# Patient Record
Sex: Female | Born: 1985 | Hispanic: Yes | Marital: Married | Smoking: Never smoker
Health system: Southern US, Community
[De-identification: ages and names within clinical notes are randomized; demographics above are authoritative.]

## PROBLEM LIST (undated history)

## (undated) DIAGNOSIS — Z789 Other specified health status: Secondary | ICD-10-CM

## (undated) HISTORY — PX: TONSILLECTOMY AND ADENOIDECTOMY: SUR1326

## (undated) HISTORY — PX: CLUB FOOT RELEASE: SHX1363

## (undated) HISTORY — PX: BARTHOLIN CYST MARSUPIALIZATION: SHX5383

---

## 2017-10-31 NOTE — L&D Delivery Note (Signed)
Pt completed the first stage rapidly after the IUPC was placed. She pushed for 25 min and was having worsening variable decels . The VE was placed at +2 station in the ROA position. She delivered one live viable female infant over an intact perineum with one push. Vaginal tear on right repaired with 3-0 chromic. Nuchal cord x 1. Placenta -S/I. EBL-400cc

## 2017-11-20 DIAGNOSIS — Z87768 Personal history of other specified (corrected) congenital malformations of integument, limbs and musculoskeletal system: Secondary | ICD-10-CM | POA: Insufficient documentation

## 2017-11-20 LAB — OB RESULTS CONSOLE GC/CHLAMYDIA
Chlamydia: NEGATIVE
GC PROBE AMP, GENITAL: NEGATIVE

## 2017-12-18 LAB — OB RESULTS CONSOLE ABO/RH: RH TYPE: POSITIVE

## 2017-12-18 LAB — OB RESULTS CONSOLE ANTIBODY SCREEN: ANTIBODY SCREEN: NEGATIVE

## 2017-12-18 LAB — OB RESULTS CONSOLE HIV ANTIBODY (ROUTINE TESTING): HIV: NONREACTIVE

## 2017-12-18 LAB — OB RESULTS CONSOLE RPR: RPR: NONREACTIVE

## 2017-12-18 LAB — OB RESULTS CONSOLE RUBELLA ANTIBODY, IGM: RUBELLA: IMMUNE

## 2017-12-18 LAB — OB RESULTS CONSOLE HEPATITIS B SURFACE ANTIGEN: HEP B S AG: NEGATIVE

## 2018-02-06 ENCOUNTER — Other Ambulatory Visit (HOSPITAL_COMMUNITY): Payer: Self-pay | Admitting: Obstetrics and Gynecology

## 2018-02-06 DIAGNOSIS — Z36 Encounter for antenatal screening for chromosomal anomalies: Secondary | ICD-10-CM

## 2018-02-07 ENCOUNTER — Encounter (HOSPITAL_COMMUNITY): Payer: Self-pay | Admitting: *Deleted

## 2018-02-08 ENCOUNTER — Encounter (HOSPITAL_COMMUNITY): Payer: Self-pay

## 2018-02-08 ENCOUNTER — Other Ambulatory Visit (HOSPITAL_COMMUNITY): Payer: Self-pay | Admitting: Obstetrics and Gynecology

## 2018-02-08 ENCOUNTER — Ambulatory Visit (HOSPITAL_COMMUNITY)
Admission: RE | Admit: 2018-02-08 | Discharge: 2018-02-08 | Disposition: A | Discharge status: Home / Self Care | Payer: 59 | Source: Ambulatory Visit | Attending: Obstetrics and Gynecology | Admitting: Obstetrics and Gynecology

## 2018-02-08 DIAGNOSIS — Z3689 Encounter for other specified antenatal screening: Secondary | ICD-10-CM

## 2018-02-08 DIAGNOSIS — Z3A20 20 weeks gestation of pregnancy: Secondary | ICD-10-CM

## 2018-02-08 DIAGNOSIS — Z8279 Family history of other congenital malformations, deformations and chromosomal abnormalities: Secondary | ICD-10-CM

## 2018-02-08 DIAGNOSIS — O43122 Velamentous insertion of umbilical cord, second trimester: Secondary | ICD-10-CM

## 2018-02-08 DIAGNOSIS — Z36 Encounter for antenatal screening for chromosomal anomalies: Secondary | ICD-10-CM

## 2018-02-08 HISTORY — DX: Other specified health status: Z78.9

## 2018-02-08 NOTE — Addendum Note (Signed)
Encounter addended by: Raoul PitchMurrow, Jalayna Josten Mae on: 02/08/2018 3:45 PM  Actions taken: Imaging Exam ended

## 2018-05-28 LAB — OB RESULTS CONSOLE GBS: STREP GROUP B AG: NEGATIVE

## 2018-07-01 ENCOUNTER — Inpatient Hospital Stay (HOSPITAL_COMMUNITY)
Admission: AD | Admit: 2018-07-01 | Discharge: 2018-07-04 | DRG: 807 | Disposition: A | Discharge status: Home / Self Care | Payer: 59 | Attending: Obstetrics and Gynecology | Admitting: Obstetrics and Gynecology

## 2018-07-01 ENCOUNTER — Encounter (HOSPITAL_COMMUNITY): Payer: Self-pay | Admitting: *Deleted

## 2018-07-01 DIAGNOSIS — Z3A41 41 weeks gestation of pregnancy: Secondary | ICD-10-CM

## 2018-07-01 DIAGNOSIS — O480 Post-term pregnancy: Principal | ICD-10-CM | POA: Diagnosis present

## 2018-07-01 DIAGNOSIS — Z3490 Encounter for supervision of normal pregnancy, unspecified, unspecified trimester: Secondary | ICD-10-CM

## 2018-07-01 LAB — CBC
HEMATOCRIT: 35.3 % — AB (ref 36.0–46.0)
HEMOGLOBIN: 12 g/dL (ref 12.0–15.0)
MCH: 30.9 pg (ref 26.0–34.0)
MCHC: 34 g/dL (ref 30.0–36.0)
MCV: 91 fL (ref 78.0–100.0)
PLATELETS: 269 10*3/uL (ref 150–400)
RBC: 3.88 MIL/uL (ref 3.87–5.11)
RDW: 13.6 % (ref 11.5–15.5)
WBC: 8.1 10*3/uL (ref 4.0–10.5)

## 2018-07-01 LAB — POCT FERN TEST: POCT Fern Test: POSITIVE

## 2018-07-01 MED ORDER — LACTATED RINGERS IV SOLN
INTRAVENOUS | Status: DC
  Administered 2018-07-01: 23:00:00 via INTRAVENOUS
  Administered 2018-07-02: 1000 mL via INTRAVENOUS
  Administered 2018-07-02 (×2): via INTRAVENOUS

## 2018-07-02 ENCOUNTER — Inpatient Hospital Stay (HOSPITAL_COMMUNITY): Payer: 59 | Admitting: Anesthesiology

## 2018-07-02 ENCOUNTER — Other Ambulatory Visit: Payer: Self-pay

## 2018-07-02 LAB — TYPE AND SCREEN
ABO/RH(D): A POS
Antibody Screen: NEGATIVE

## 2018-07-02 LAB — ABO/RH: ABO/RH(D): A POS

## 2018-07-02 LAB — RPR: RPR Ser Ql: NONREACTIVE

## 2018-07-02 MED ORDER — TERBUTALINE SULFATE 1 MG/ML IJ SOLN
0.2500 mg | Freq: Once | INTRAMUSCULAR | Status: DC | PRN
  Filled 2018-07-02: qty 1

## 2018-07-02 MED ORDER — OXYTOCIN 40 UNITS IN LACTATED RINGERS INFUSION - SIMPLE MED: 2.5000 [IU]/h | INTRAVENOUS | Status: DC

## 2018-07-02 MED ORDER — EPHEDRINE 5 MG/ML INJ
10.0000 mg | INTRAVENOUS | Status: DC | PRN
  Filled 2018-07-02: qty 2
  Filled 2018-07-02: qty 4

## 2018-07-02 MED ORDER — FENTANYL CITRATE (PF) 100 MCG/2ML IJ SOLN
100.0000 ug | INTRAMUSCULAR | Status: DC | PRN
  Administered 2018-07-02: 100 ug via INTRAVENOUS

## 2018-07-02 MED ORDER — OXYTOCIN 40 UNITS IN LACTATED RINGERS INFUSION - SIMPLE MED
INTRAVENOUS | Status: AC
  Filled 2018-07-02: qty 1000

## 2018-07-02 MED ORDER — OXYCODONE-ACETAMINOPHEN 5-325 MG PO TABS: 2.0000 | ORAL_TABLET | ORAL | Status: DC | PRN

## 2018-07-02 MED ORDER — OXYTOCIN 40 UNITS IN LACTATED RINGERS INFUSION - SIMPLE MED
1.0000 m[IU]/min | INTRAVENOUS | Status: DC
  Administered 2018-07-02: 2 m[IU]/min via INTRAVENOUS

## 2018-07-02 MED ORDER — SODIUM BICARBONATE 8.4 % IV SOLN
INTRAVENOUS | Status: DC | PRN
  Administered 2018-07-02 (×2): 4 mL via EPIDURAL

## 2018-07-02 MED ORDER — FLEET ENEMA 7-19 GM/118ML RE ENEM: 1.0000 | ENEMA | RECTAL | Status: DC | PRN

## 2018-07-02 MED ORDER — ONDANSETRON HCL 4 MG/2ML IJ SOLN: 4.0000 mg | Freq: Four times a day (QID) | INTRAMUSCULAR | Status: DC | PRN

## 2018-07-02 MED ORDER — LACTATED RINGERS IV SOLN: 500.0000 mL | Freq: Once | INTRAVENOUS | Status: DC

## 2018-07-02 MED ORDER — OXYCODONE-ACETAMINOPHEN 5-325 MG PO TABS: 1.0000 | ORAL_TABLET | ORAL | Status: DC | PRN

## 2018-07-02 MED ORDER — FENTANYL 2.5 MCG/ML BUPIVACAINE 1/10 % EPIDURAL INFUSION (WH - ANES)
14.0000 mL/h | INTRAMUSCULAR | Status: DC | PRN
  Administered 2018-07-02 (×3): 14 mL/h via EPIDURAL
  Filled 2018-07-02 (×2): qty 100

## 2018-07-02 MED ORDER — ACETAMINOPHEN 325 MG PO TABS: 650.0000 mg | ORAL_TABLET | ORAL | Status: DC | PRN

## 2018-07-02 MED ORDER — DIPHENHYDRAMINE HCL 50 MG/ML IJ SOLN: 12.5000 mg | INTRAMUSCULAR | Status: DC | PRN

## 2018-07-02 MED ORDER — PHENYLEPHRINE 40 MCG/ML (10ML) SYRINGE FOR IV PUSH (FOR BLOOD PRESSURE SUPPORT)
80.0000 ug | PREFILLED_SYRINGE | INTRAVENOUS | Status: DC | PRN
  Administered 2018-07-02: 40 ug via INTRAVENOUS
  Filled 2018-07-02: qty 10
  Filled 2018-07-02: qty 5
  Filled 2018-07-02: qty 10

## 2018-07-02 MED ORDER — FENTANYL CITRATE (PF) 100 MCG/2ML IJ SOLN
INTRAMUSCULAR | Status: AC
  Filled 2018-07-02: qty 2

## 2018-07-02 MED ORDER — BUPIVACAINE HCL (PF) 0.25 % IJ SOLN
INTRAMUSCULAR | Status: DC | PRN
  Administered 2018-07-02: 8 mL via EPIDURAL

## 2018-07-02 MED ORDER — LIDOCAINE HCL (PF) 1 % IJ SOLN
30.0000 mL | INTRAMUSCULAR | Status: DC | PRN
  Administered 2018-07-02: 30 mL via SUBCUTANEOUS
  Filled 2018-07-02: qty 30

## 2018-07-02 MED ORDER — IBUPROFEN 600 MG PO TABS
600.0000 mg | ORAL_TABLET | Freq: Four times a day (QID) | ORAL | Status: DC
  Administered 2018-07-02 – 2018-07-04 (×7): 600 mg via ORAL
  Filled 2018-07-02 (×6): qty 1

## 2018-07-02 MED ORDER — OXYTOCIN BOLUS FROM INFUSION
500.0000 mL | Freq: Once | INTRAVENOUS | Status: AC
  Administered 2018-07-02: 500 mL via INTRAVENOUS

## 2018-07-02 MED ORDER — EPHEDRINE 5 MG/ML INJ
10.0000 mg | INTRAVENOUS | Status: DC | PRN
  Administered 2018-07-02: 10 mg via INTRAVENOUS
  Filled 2018-07-02: qty 2

## 2018-07-02 MED ORDER — LACTATED RINGERS IV SOLN: 500.0000 mL | INTRAVENOUS | Status: DC | PRN

## 2018-07-02 MED ORDER — PHENYLEPHRINE 40 MCG/ML (10ML) SYRINGE FOR IV PUSH (FOR BLOOD PRESSURE SUPPORT)
80.0000 ug | PREFILLED_SYRINGE | INTRAVENOUS | Status: AC | PRN
  Administered 2018-07-02 (×3): 40 ug via INTRAVENOUS

## 2018-07-02 MED ORDER — SOD CITRATE-CITRIC ACID 500-334 MG/5ML PO SOLN: 30.0000 mL | ORAL | Status: DC | PRN

## 2018-07-02 NOTE — Anesthesia Preprocedure Evaluation (Signed)
Anesthesia Evaluation  Patient identified by MRN, date of birth, ID band Patient awake    Reviewed: Allergy & Precautions, NPO status , Patient's Chart, lab work & pertinent test results  Airway Mallampati: I  TM Distance: >3 FB Neck ROM: Full    Dental no notable dental hx.    Pulmonary neg pulmonary ROS,    Pulmonary exam normal breath sounds clear to auscultation       Cardiovascular negative cardio ROS Normal cardiovascular exam Rhythm:Regular Rate:Normal     Neuro/Psych negative neurological ROS  negative psych ROS   GI/Hepatic negative GI ROS, Neg liver ROS,   Endo/Other  negative endocrine ROS  Renal/GU negative Renal ROS  negative genitourinary   Musculoskeletal negative musculoskeletal ROS (+)   Abdominal   Peds  Hematology negative hematology ROS (+)   Anesthesia Other Findings   Reproductive/Obstetrics (+) Pregnancy                             Anesthesia Physical Anesthesia Plan  ASA: II  Anesthesia Plan: Epidural   Post-op Pain Management:    Induction:   PONV Risk Score and Plan: Treatment may vary due to age or medical condition  Airway Management Planned: Natural Airway  Additional Equipment:   Intra-op Plan:   Post-operative Plan:   Informed Consent: I have reviewed the patients History and Physical, chart, labs and discussed the procedure including the risks, benefits and alternatives for the proposed anesthesia with the patient or authorized representative who has indicated his/her understanding and acceptance.       Plan Discussed with: Anesthesiologist  Anesthesia Plan Comments: (Patient identified. Risks, benefits, options discussed with patient including but not limited to bleeding, infection, nerve damage, paralysis, failed block, incomplete pain control, headache, blood pressure changes, nausea, vomiting, reactions to medication, itching, and  post partum back pain. Confirmed with bedside nurse the patient's most recent platelet count. Confirmed with the patient that they are not taking any anticoagulation, have any bleeding history or any family history of bleeding disorders. Patient expressed understanding and wishes to proceed. All questions were answered. )        Anesthesia Quick Evaluation  

## 2018-07-02 NOTE — Anesthesia Pain Management Evaluation Note (Signed)
  CRNA Pain Management Visit Note  Patient: Jordan Mendez, 32 y.o., female  "Hello I am a member of the anesthesia team at [facility]. We have an anesthesia team available at all times to provide care throughout the hospital, including epidural management and anesthesia for C-section. I don't know your plan for the delivery whether it a natural birth, water birth, IV sedation, nitrous supplementation, doula or epidural, but we want to meet your pain goals."   1.Was your pain managed to your expectations on prior hospitalizations?   No prior hospitalizations  2.What is your expectation for pain management during this hospitalization?     Epidural  3.How can we help you reach that goal? Epidural requested.  Record the patient's initial score and the patient's pain goal.   Pain: 6  Pain Goal: 6 The [facility] wants you to be able to say your pain was always managed very well.  Cordai Rodrigue 07/02/2018

## 2018-07-02 NOTE — H&P (Signed)
Jordan Mendez is an 32 y.o. G1P0 [redacted]w[redacted]d hispanic female who presented to the ER with possible ROM. She was scheduled for an induction last night for post term preg. Her PNC was uncomplicated. She did not have genetic testing. She has a nl Ogtt/ Neg GBS. Chief Complaint: HPI:  Past Medical History:  Diagnosis Date  . Medical history non-contributory     Past Surgical History:  Procedure Laterality Date  . BARTHOLIN CYST MARSUPIALIZATION    . CLUB FOOT RELEASE    . TONSILLECTOMY AND ADENOIDECTOMY      Family History  Problem Relation Age of Onset  . Diabetes Maternal Grandmother    Social History:  reports that she has never smoked. She has never used smokeless tobacco. She reports that she drank alcohol. She reports that she does not use drugs.  Allergies: No Known Allergies  Medications Prior to Admission  Medication Sig Dispense Refill  . FOLIC ACID PO Take 1 tablet by mouth daily.     . Prenatal Multivit-Min-Fe-FA (PRENATAL VITAMINS PO) Take 1 tablet by mouth daily.          Blood pressure 134/78, pulse 71, temperature 98 F (36.7 C), temperature source Axillary, resp. rate 16, height 5\' 1"  (1.549 m), weight 67.1 kg, last menstrual period 09/24/2017, SpO2 95 %. General appearance: alert and cooperative Abdomen: gravid, non tender   Lab Results  Component Value Date   WBC 8.1 07/01/2018   HGB 12.0 07/01/2018   HCT 35.3 (L) 07/01/2018   MCV 91.0 07/01/2018   PLT 269 07/01/2018   No results found for: PREGTESTUR, PREGSERUM, HCG, HCGQUANT     Patient Active Problem List   Diagnosis Date Noted  . Normal labor 07/02/2018   IMP/ IUP at 41 weeks Plan/ Will start induction for post term preg  Jordan Mendez E 07/02/2018, 7:12 PM

## 2018-07-02 NOTE — Progress Notes (Signed)
This note also relates to the following rows which could not be included: BP - Cannot attach notes to unvalidated device data Pulse Rate - Cannot attach notes to unvalidated device data  Dr Mackey Birchwood decreased epidural rate to 10, and is pushing ephedrine

## 2018-07-02 NOTE — Progress Notes (Signed)
Pt had AROM this am when she was 4cm. She was also started on pit aug. She has had a protracted labor curve. Her pelvis appears to be adequate . EFW- 7-8 PLAN/ Will place an IUPC to assess the strength of the contractions.

## 2018-07-02 NOTE — Progress Notes (Signed)
This note also relates to the following rows which could not be included: BP - Cannot attach notes to unvalidated device data Pulse Rate - Cannot attach notes to unvalidated device data  DR Armond Hang in room

## 2018-07-02 NOTE — Anesthesia Procedure Notes (Signed)
Epidural Patient location during procedure: OB Start time: 07/02/2018 8:36 AM End time: 07/02/2018 8:50 AM  Staffing Anesthesiologist: Elmer Picker, MD Performed: anesthesiologist   Preanesthetic Checklist Completed: patient identified, pre-op evaluation, timeout performed, IV checked, risks and benefits discussed and monitors and equipment checked  Epidural Patient position: sitting Prep: site prepped and draped and DuraPrep Patient monitoring: continuous pulse ox, blood pressure, heart rate and cardiac monitor Approach: midline Location: L3-L4 Injection technique: LOR air  Needle:  Needle type: Tuohy  Needle gauge: 17 G Needle length: 9 cm Needle insertion depth: 5.5 cm Catheter type: closed end flexible Catheter size: 19 Gauge Catheter at skin depth: 11 cm Test dose: negative  Assessment Sensory level: T8 Events: blood not aspirated, injection not painful, no injection resistance, negative IV test and no paresthesia  Additional Notes Patient identified. Risks/Benefits/Options discussed with patient including but not limited to bleeding, infection, nerve damage, paralysis, failed block, incomplete pain control, headache, blood pressure changes, nausea, vomiting, reactions to medication both or allergic, itching and postpartum back pain. Confirmed with bedside nurse the patient's most recent platelet count. Confirmed with patient that they are not currently taking any anticoagulation, have any bleeding history or any family history of bleeding disorders. Patient expressed understanding and wished to proceed. All questions were answered. Sterile technique was used throughout the entire procedure. Please see nursing notes for vital signs. Test dose was given through epidural catheter and negative prior to continuing to dose epidural or start infusion. Warning signs of high block given to the patient including shortness of breath, tingling/numbness in hands, complete motor block,  or any concerning symptoms with instructions to call for help. Patient was given instructions on fall risk and not to get out of bed. All questions and concerns addressed with instructions to call with any issues or inadequate analgesia.  Reason for block:procedure for pain

## 2018-07-02 NOTE — Progress Notes (Signed)
This note also relates to the following rows which could not be included: BP - Cannot attach notes to unvalidated device data Pulse Rate - Cannot attach notes to unvalidated device data  40 mcg Phenyephrine per Dr Dareen Piano request

## 2018-07-02 NOTE — Progress Notes (Signed)
This note also relates to the following rows which could not be included: BP - Cannot attach notes to unvalidated device data Pulse Rate - Cannot attach notes to unvalidated device data SpO2 - Cannot attach notes to unvalidated device data  Pt feels hot and see spots , resolved inside of 1 min

## 2018-07-03 ENCOUNTER — Encounter (HOSPITAL_COMMUNITY): Payer: Self-pay

## 2018-07-03 DIAGNOSIS — Z3490 Encounter for supervision of normal pregnancy, unspecified, unspecified trimester: Secondary | ICD-10-CM

## 2018-07-03 LAB — CBC
HEMATOCRIT: 26.6 % — AB (ref 36.0–46.0)
HEMOGLOBIN: 9.3 g/dL — AB (ref 12.0–15.0)
MCH: 31.7 pg (ref 26.0–34.0)
MCHC: 35 g/dL (ref 30.0–36.0)
MCV: 90.8 fL (ref 78.0–100.0)
Platelets: 248 10*3/uL (ref 150–400)
RBC: 2.93 MIL/uL — ABNORMAL LOW (ref 3.87–5.11)
RDW: 13.8 % (ref 11.5–15.5)
WBC: 15 10*3/uL — ABNORMAL HIGH (ref 4.0–10.5)

## 2018-07-03 MED ORDER — MEASLES, MUMPS & RUBELLA VAC ~~LOC~~ INJ
0.5000 mL | INJECTION | Freq: Once | SUBCUTANEOUS | Status: DC
  Filled 2018-07-03: qty 0.5

## 2018-07-03 MED ORDER — TETANUS-DIPHTH-ACELL PERTUSSIS 5-2.5-18.5 LF-MCG/0.5 IM SUSP: 0.5000 mL | Freq: Once | INTRAMUSCULAR | Status: DC

## 2018-07-03 MED ORDER — FERROUS SULFATE 325 (65 FE) MG PO TABS
325.0000 mg | ORAL_TABLET | Freq: Every day | ORAL | Status: DC
  Administered 2018-07-03 – 2018-07-04 (×2): 325 mg via ORAL
  Filled 2018-07-03 (×2): qty 1

## 2018-07-03 MED ORDER — BENZOCAINE-MENTHOL 20-0.5 % EX AERO
1.0000 "application " | INHALATION_SPRAY | CUTANEOUS | Status: DC | PRN
  Administered 2018-07-03: 1 via TOPICAL
  Filled 2018-07-03: qty 56

## 2018-07-03 MED ORDER — ONDANSETRON HCL 4 MG PO TABS: 4.0000 mg | ORAL_TABLET | ORAL | Status: DC | PRN

## 2018-07-03 MED ORDER — DIBUCAINE 1 % RE OINT: 1.0000 "application " | TOPICAL_OINTMENT | RECTAL | Status: DC | PRN

## 2018-07-03 MED ORDER — ACETAMINOPHEN 325 MG PO TABS
650.0000 mg | ORAL_TABLET | ORAL | Status: DC | PRN
  Administered 2018-07-03 (×2): 650 mg via ORAL
  Filled 2018-07-03 (×2): qty 2

## 2018-07-03 MED ORDER — COCONUT OIL OIL
1.0000 "application " | TOPICAL_OIL | Status: DC | PRN
  Administered 2018-07-04: 1 via TOPICAL
  Filled 2018-07-03: qty 120

## 2018-07-03 MED ORDER — ONDANSETRON HCL 4 MG/2ML IJ SOLN: 4.0000 mg | INTRAMUSCULAR | Status: DC | PRN

## 2018-07-03 MED ORDER — WITCH HAZEL-GLYCERIN EX PADS: 1.0000 "application " | MEDICATED_PAD | CUTANEOUS | Status: DC | PRN

## 2018-07-03 MED ORDER — OXYCODONE-ACETAMINOPHEN 5-325 MG PO TABS: 2.0000 | ORAL_TABLET | ORAL | Status: DC | PRN

## 2018-07-03 MED ORDER — SENNOSIDES-DOCUSATE SODIUM 8.6-50 MG PO TABS
2.0000 | ORAL_TABLET | ORAL | Status: DC
  Administered 2018-07-03: 2 via ORAL

## 2018-07-03 MED ORDER — OXYCODONE-ACETAMINOPHEN 5-325 MG PO TABS
1.0000 | ORAL_TABLET | ORAL | Status: DC | PRN
  Filled 2018-07-03: qty 1

## 2018-07-03 MED ORDER — ZOLPIDEM TARTRATE 5 MG PO TABS: 5.0000 mg | ORAL_TABLET | Freq: Every evening | ORAL | Status: DC | PRN

## 2018-07-03 MED ORDER — SIMETHICONE 80 MG PO CHEW: 80.0000 mg | CHEWABLE_TABLET | ORAL | Status: DC | PRN

## 2018-07-03 NOTE — Progress Notes (Signed)
Patient is eating, ambulating, voiding.  Pain control is good.  Vitals:   07/03/18 0025 07/03/18 0057 07/03/18 0149 07/03/18 0552  BP:  126/80 122/79 110/77  Pulse:  98 (!) 109 94  Resp: 16 18 18 18   Temp:  98.1 F (36.7 C) 97.9 F (36.6 C) 98.3 F (36.8 C)  TempSrc:  Oral Oral Oral  SpO2:  97%    Weight:      Height:        Fundus firm Perineum without swelling.  Lab Results  Component Value Date   WBC 15.0 (H) 07/03/2018   HGB 9.3 (L) 07/03/2018   HCT 26.6 (L) 07/03/2018   MCV 90.8 07/03/2018   PLT 248 07/03/2018    --/--/A POS, A POS Performed at [facility], [address] [place], [place] [address]  (09/01 2309)/[place]  A/P Post partum day 1.  Routine care.  Expect d/c tomorrow.  Iron.  Jennifer Payes A

## 2018-07-03 NOTE — Anesthesia Postprocedure Evaluation (Signed)
Anesthesia Post Note  Patient: Jordan Mendez  Procedure(s) Performed: AN AD HOC LABOR EPIDURAL     Patient location during evaluation: Mother Baby Anesthesia Type: Epidural Level of consciousness: awake and alert and oriented Pain management: satisfactory to patient Vital Signs Assessment: post-procedure vital signs reviewed and stable Respiratory status: respiratory function stable Cardiovascular status: stable Postop Assessment: no headache, no backache, epidural receding, patient able to bend at knees, no signs of nausea or vomiting and adequate PO intake Anesthetic complications: no    Last Vitals:  Vitals:   07/03/18 0149 07/03/18 0552  BP: 122/79 110/77  Pulse: (!) 109 94  Resp: 18 18  Temp: 36.6 C 36.8 C  SpO2:      Last Pain:  Vitals:   07/03/18 0552  TempSrc: Oral  PainSc: 0-No pain   Pain Goal: Patients Stated Pain Goal: 2 (07/02/18 2349)               Karleen Dolphin

## 2018-07-04 MED ORDER — DOCUSATE SODIUM 100 MG PO CAPS: 100.0000 mg | ORAL_CAPSULE | Freq: Two times a day (BID) | ORAL | 0 refills | Status: DC

## 2018-07-04 MED ORDER — OXYCODONE-ACETAMINOPHEN 5-325 MG PO TABS: 1.0000 | ORAL_TABLET | Freq: Four times a day (QID) | ORAL | 0 refills | Status: DC | PRN

## 2018-07-04 MED ORDER — IBUPROFEN 600 MG PO TABS: 600.0000 mg | ORAL_TABLET | Freq: Four times a day (QID) | ORAL | 0 refills | Status: DC | PRN

## 2018-07-04 NOTE — Lactation Note (Signed)
This note was copied from a baby's chart. Lactation Consultation Note  Patient Name: Jordan Mendez [id number] Date: 07/04/2018 Reason for consult: Follow-up assessment;Nipple pain/trauma  Visited with P1 Mom of term baby at 18 hrs old.  Mom states her nipples are sore, no observable nipple trauma noted. Breasts small, firm, small areola, and long erect nipples.   Offered to assist with positioning and latching baby deeper onto breast.  Positioned baby in cross cradle hold with added pillow support.  Mom latched baby before she opened widely.  Took baby off, nipple elongated and slightly pinched.  Burped baby, and repositioned with instructions to wait for baby to open wider.  Colostrum easily expressed. Baby started to show signs of struggling to get her breath.  Took baby away from breast.  Baby became dusky circumorally.  Bulb syringe used.  Baby struggled for about a minute with on and off crying.  Baby spit up mucousy yellow emesis, small amount. Baby's color quickly recovered, with normal breathing noted. After baby recovered, about 3-5 mins. Positioned baby in cross cradle hold, when she started acting like she was choking again.   Sat baby up and patted her back.  She spit a medium amount of frothy emesis, bulb syringe used.  Baby's color remained good throughout. Baby placed STS on Mom's chest.  Baby resting.   Mom to call for latch assist when baby starts cueing.  Consult Status Consult Status: Follow-up Date: 07/04/18 Follow-up type: In-patient    [name] 07/04/2018, 10:23 AM

## 2018-07-04 NOTE — Discharge Instructions (Signed)
 Lactancia materna Breastfeeding Decidir amamantar es una de las mejores elecciones que puede hacer por usted y su beb. Un cambio en las hormonas durante el embarazo hace que las mamas produzcan leche materna en las glndulas productoras de leche. Las hormonas impiden que la leche materna sea liberada antes del nacimiento del beb. Adems, impulsan el flujo de leche luego del nacimiento. Una vez que ha comenzado a amamantar, pensar en el beb, as como la succin o el llanto, pueden estimular la liberacin de leche de las glndulas productoras de leche. Los beneficios de amamantar Las investigaciones demuestran que la lactancia materna ofrece muchos beneficios de salud para bebs y madres. Adems, ofrece una forma gratuita y conveniente de alimentar al beb. Para el beb  La primera leche (calostro) ayuda a mejorar el funcionamiento del aparato digestivo del beb.  Las clulas especiales de la leche (anticuerpos) ayudan a combatir las infecciones en el beb.  Los bebs que se alimentan con leche materna tambin tienen menos probabilidades de tener asma, alergias, obesidad o diabetes de tipo 2. Adems, tienen menor riesgo de sufrir el sndrome de muerte sbita del lactante (SMSL).  Los nutrientes de la leche materna son mejores para satisfacer las necesidades del beb en comparacin con la leche maternizada.  La leche materna mejora el desarrollo cerebral del beb. Para usted  La lactancia materna favorece el desarrollo de un vnculo muy especial entre la madre y el beb.  Es conveniente. La leche materna es econmica y siempre est disponible a la temperatura correcta.  La lactancia materna ayuda a quemar caloras. Le ayuda a perder el peso ganado durante el embarazo.  Hace que el tero vuelva al tamao que tena antes del embarazo ms rpido. Adems, disminuye el sangrado (loquios) despus del parto.  La lactancia materna contribuye a reducir el riesgo de tener diabetes de tipo 2,  osteoporosis, artritis reumatoide, enfermedades cardiovasculares y cncer de mama, ovario, tero y endometrio en el futuro. Informacin bsica sobre la lactancia Comienzo de la lactancia  Encuentre un lugar cmodo para sentarse o acostarse, con un buen respaldo para el cuello y la espalda.  Coloque una almohada o una manta enrollada debajo del beb para acomodarlo a la altura de la mama (si est sentada). Las almohadas para amamantar se han diseado especialmente a fin de servir de apoyo para los brazos y el beb mientras amamanta.  Asegrese de que la barriga del beb (abdomen) est frente a la suya.  Masajee suavemente la mama. Con las yemas de los dedos, masajee los bordes exteriores de la mama hacia adentro, en direccin al pezn. Esto estimula el flujo de leche. Si la leche fluye lentamente, es posible que deba continuar con este movimiento durante la lactancia.  Sostenga la mama con 4 dedos por debajo y el pulgar por arriba del pezn (forme la letra "C" con la mano). Asegrese de que los dedos se encuentren lejos del pezn y de la boca del beb.  Empuje suavemente los labios del beb con el pezn o con el dedo.  Cuando la boca del beb se abra lo suficiente, acrquelo rpidamente a la mama e introduzca todo el pezn y la arola, tanto como sea posible, dentro de la boca del beb. La arola es la zona de color que rodea al pezn. ? Debe haber ms arola visible por arriba del labio superior del beb que por debajo del labio inferior. ? Los labios del beb deben estar abiertos y extendidos hacia afuera (evertidos) para asegurar que   el beb se prenda de forma adecuada y cmoda. ? La lengua del beb debe estar entre la enca inferior y la mama.  Asegrese de que la boca del beb est en la posicin correcta alrededor del pezn (prendido). Los labios del beb deben crear un sello sobre la mama y estar doblados hacia afuera (invertidos).  Es comn que el beb succione durante 2 a 3 minutos  para que comience el flujo de leche materna. Cmo debe prenderse Es muy importante que le ensee al beb cmo prenderse adecuadamente a la mama. Si el beb no se prende adecuadamente, puede causar dolor en los pezones, reducir la produccin de leche materna y hacer que el beb tenga un escaso aumento de peso. Adems, si el beb no se prende adecuadamente al pezn, puede tragar aire durante la alimentacin. Esto puede causarle molestias al beb. Hacer eructar al beb al cambiar de mama puede ayudarlo a liberar el aire. Sin embargo, ensearle al beb cmo prenderse a la mama adecuadamente es la mejor manera de evitar que se sienta molesto por tragar aire mientras se alimenta. Signos de que el beb se ha prendido adecuadamente al pezn  Tironea o succiona de modo silencioso, sin causarle dolor. Los labios del beb deben estar extendidos hacia afuera (evertidos).  Se escucha que traga cada 3 o 4 succiones una vez que la leche ha comenzado a fluir (despus de que se produzca el reflejo de eyeccin de la leche).  Hay movimientos musculares por arriba y por delante de sus odos al succionar.  Signos de que el beb no se ha prendido adecuadamente al pezn  Hace ruidos de succin o de chasquido mientras se alimenta.  Siente dolor en los pezones.  Si cree que el beb no se prendi correctamente, deslice el dedo en la comisura de la boca y colquelo entre las encas del beb para interrumpir la succin. Intente volver a comenzar a amamantar. Signos de lactancia materna exitosa Signos del beb  El beb disminuir gradualmente el nmero de succiones o dejar de succionar por completo.  El beb se quedar dormido.  El cuerpo del beb se relajar.  El beb retendr una pequea cantidad de leche en la boca.  El beb se desprender solo del pecho.  Signos que presenta usted  Las mamas han aumentado la firmeza, el peso y el tamao 1 a 3 horas despus de amamantar.  Estn ms blandas inmediatamente  despus de amamantar.  Se producen un aumento del volumen de leche y un cambio en su consistencia y color hacia el quinto da de lactancia.  Los pezones no duelen, no estn agrietados ni sangran.  Signos de que su beb recibe la cantidad de leche suficiente  Mojar por lo menos 1 o 2paales durante las primeras 24horas despus del nacimiento.  Mojar por lo menos 5 o 6paales cada 24horas durante la primera semana despus del nacimiento. La orina debe ser clara o de color amarillo plido a los 5das de vida.  Mojar entre 6 y 8paales cada 24horas a medida que el beb sigue creciendo y desarrollndose.  Defeca por lo menos 3 veces en 24 horas a los 5 das de vida. Las heces deben ser blandas y amarillentas.  Defeca por lo menos 3 veces en 24 horas a los 7 das de vida. Las heces deben ser grumosas y amarillentas.  No registra una prdida de peso mayor al 10% del peso al nacer durante los primeros 3 das de vida.  Aumenta de peso un   promedio de 4 a 7onzas (113 a 198g) por semana despus de los 4 das de vida.  Aumenta de peso, diariamente, de manera uniforme a partir de los 5 das de vida, sin registrar prdida de peso despus de las 2semanas de vida. Despus de alimentarse, es posible que el beb regurgite una pequea cantidad de leche. Esto es normal. Frecuencia y duracin de la lactancia El amamantamiento frecuente la ayudar a producir ms leche y puede prevenir dolores en los pezones y las mamas extremadamente llenas (congestin mamaria). Alimente al beb cuando muestre signos de hambre o si siente la necesidad de reducir la congestin de las mamas. Esto se denomina "lactancia a demanda". Las seales de que el beb tiene hambre incluyen las siguientes:  Aumento del estado de alerta, actividad o inquietud.  Mueve la cabeza de un lado a otro.  Abre la boca cuando se le toca la mejilla o la comisura de la boca (reflejo de bsqueda).  Aumenta las vocalizaciones, tales como  sonidos de succin, se relame los labios, emite arrullos, suspiros o chirridos.  Mueve la mano hacia la boca y se chupa los dedos o las manos.  Est molesto o llora.  Evite el uso del chupete en las primeras 4 a 6 semanas despus del nacimiento del beb. Despus de este perodo, podr usar un chupete. Las investigaciones demostraron que el uso del chupete durante el primer ao de vida del beb disminuye el riesgo de tener el sndrome de muerte sbita del lactante (SMSL). Permita que el nio se alimente en cada mama todo lo que desee. Cuando el beb se desprende o se queda dormido mientras se est alimentando de la primera mama, ofrzcale la segunda. Debido a que, con frecuencia, los recin nacidos estn somnolientos las primeras semanas de vida, es posible que deba despertar al beb para alimentarlo. Los horarios de lactancia varan de un beb a otro. Sin embargo, las siguientes reglas pueden servir como gua para ayudarla a garantizar que el beb se alimenta adecuadamente:  Se puede amamantar a los recin nacidos (bebs de 4 semanas o menos de vida) cada 1 a 3 horas.  No deben transcurrir ms de 3 horas durante el da o 5 horas durante la noche sin que se amamante a los recin nacidos.  Debe amamantar al beb un mnimo de 8 veces en un perodo de 24 horas.  Extraccin de leche materna La extraccin y el almacenamiento de la leche materna le permiten asegurarse de que el beb se alimente exclusivamente de su leche materna, aun en momentos en los que no puede amamantar. Esto tiene especial importancia si debe regresar al trabajo en el perodo en que an est amamantando o si no puede estar presente en los momentos en que el beb debe alimentarse. Su asesor en lactancia puede ayudarla a encontrar un mtodo de extraccin que funcione mejor para usted y orientarla sobre cunto tiempo es seguro almacenar leche materna. Cmo cuidar las mamas durante la lactancia Los pezones pueden secarse, agrietarse y  doler durante la lactancia. Las siguientes recomendaciones pueden ayudarla a mantener las mamas humectadas y sanas:  Evite usar jabn en los pezones.  Use un sostn de soporte diseado especialmente para la lactancia materna. Evite usar sostenes con aro o sostenes muy ajustados (sostenes deportivos).  Seque al aire sus pezones durante 3 a 4minutos despus de amamantar al beb.  Utilice solo apsitos de algodn en el sostn para absorber las prdidas de leche. La prdida de un poco de leche materna   entre las tomas es normal.  Utilice lanolina sobre los pezones luego de amamantar. La lanolina ayuda a mantener la humedad normal de la piel. La lanolina pura no es perjudicial (no es txica) para el beb. Adems, puede extraer manualmente algunas gotas de leche materna y masajear suavemente esa leche sobre los pezones para que la leche se seque al aire.  Durante las primeras semanas despus del nacimiento, algunas mujeres experimentan congestin mamaria. La congestin mamaria puede hacer que sienta las mamas pesadas, calientes y sensibles al tacto. El pico de la congestin mamaria ocurre en el plazo de los 3 a 5 das despus del parto. Las siguientes recomendaciones pueden ayudarla a aliviar la congestin mamaria:  Vace por completo las mamas al amamantar o extraer leche. Puede aplicar calor hmedo en las mamas (en la ducha o con toallas hmedas para manos) antes de amamantar o extraer leche. Esto aumenta la circulacin y ayuda a que la leche fluya. Si el beb no vaca por completo las mamas cuando lo amamanta, extraiga la leche restante despus de que haya finalizado.  Aplique compresas de hielo sobre las mamas inmediatamente despus de amamantar o extraer leche, a menos que le resulte demasiado incmodo. Haga lo siguiente: ? Ponga el hielo en una bolsa plstica. ? Coloque una toalla entre la piel y la bolsa de hielo. ? Coloque el hielo durante 20minutos, 2 o 3veces por da.  Asegrese de que el  beb est prendido y se encuentre en la posicin correcta mientras lo alimenta.  Si la congestin mamaria persiste luego de 48 horas o despus de seguir estas recomendaciones, comunquese con su mdico o un asesor en lactancia. Recomendaciones de salud general durante la lactancia  Consuma 3 comidas y 3 colaciones saludables todos los das. Las madres bien alimentadas que amamantan necesitan entre 450 y 500 caloras adicionales por da. Puede cumplir con este requisito al aumentar la cantidad de una dieta equilibrada que realice.  Beba suficiente agua para mantener la orina clara o de color amarillo plido.  Descanse con frecuencia, reljese y siga tomando sus vitaminas prenatales para prevenir la fatiga, el estrs y los niveles bajos de vitaminas y minerales en el cuerpo (deficiencias de nutrientes).  No consuma ningn producto que contenga nicotina o tabaco, como cigarrillos y cigarrillos electrnicos. El beb puede verse afectado por las sustancias qumicas de los cigarrillos que pasan a la leche materna y por la exposicin al humo ambiental del tabaco. Si necesita ayuda para dejar de fumar, consulte al mdico.  Evite el consumo de alcohol.  No consuma drogas ilegales o marihuana.  Antes de usar cualquier medicamento, hable con el mdico. Estos incluyen medicamentos recetados y de venta libre, como tambin vitaminas y suplementos a base de hierbas. Algunos medicamentos, que pueden ser perjudiciales para el beb, pueden pasar a travs de la leche materna.  Puede quedar embarazada durante la lactancia. Si se desea un mtodo anticonceptivo, consulte al mdico sobre cules son las opciones seguras durante la lactancia. Dnde encontrar ms informacin: Liga internacional La Leche: www.llli.org. Comunquese con un mdico si:  Siente que quiere dejar de amamantar o se siente frustrada con la lactancia.  Sus pezones estn agrietados o sangran.  Sus mamas estn irritadas, sensibles o  calientes.  Tiene los siguientes sntomas: ? Dolor en las mamas o en los pezones. ? Un rea hinchada en cualquiera de las mamas. ? Fiebre o escalofros. ? Nuseas o vmitos. ? Drenaje de otro lquido distinto de la leche materna desde los pezones.    Sus mamas no se llenan antes de amamantar al beb para el quinto da despus del parto.  Se siente triste y deprimida.  El beb: ? Est demasiado somnoliento como para comer bien. ? Tiene problemas para dormir. ? Tiene ms de 1 semana de vida y moja menos de 6 paales en un periodo de 24 horas. ? No ha aumentado de peso a los 5 das de vida.  El beb defeca menos de 3 veces en 24 horas.  La piel del beb o las partes blancas de los ojos se vuelven amarillentas. Solicite ayuda de inmediato si:  El beb est muy cansado (letargo) y no se quiere despertar para comer.  Le sube la fiebre sin causa. Resumen  La lactancia materna ofrece muchos beneficios de salud para bebs y madres.  Intente amamantar a su beb cuando muestre signos tempranos de hambre.  Haga cosquillas o empuje suavemente los labios del beb con el dedo o el pezn para lograr que el beb abra la boca. Acerque el beb a la mama. Asegrese de que la mayor parte de la arola se encuentre dentro de la boca del beb. Ofrzcale una mama y haga eructar al beb antes de pasar a la otra.  Hable con su mdico o asesor en lactancia si tiene dudas o problemas con la lactancia. Esta informacin no tiene como fin reemplazar el consejo del mdico. Asegrese de hacerle al mdico cualquier pregunta que tenga. Document Released: 10/17/2005 Document Revised: 02/06/2017 Document Reviewed: 02/06/2017 Elsevier Interactive Patient Education  2018 [employer].  

## 2018-07-04 NOTE — Lactation Note (Signed)
This note was copied from a baby's chart. Lactation Consultation Note  Patient Name: Jordan Mendez [id number] Date: 07/04/2018 Reason for consult: Follow-up assessment  Latch assist  Baby latched in football hold on right breast.  Baby latched on deeply with tenderness felt for initial 30 seconds.  Baby nutritive and regular swallows identified.  Taught Mom how to use alternate breast compression to increase milk transfer. Reviewed importance of STS and cue based feedings, goal of >8 feedings per 24 hrs.   Engorgement prevention and treatment reviewed. Hand pump given with instructions on use and cleaning.  Mom aware of OP lactation support available, encouraged to call prn. Mom has appt with IBCLC at [facility] tomorrow.   LATCH Score Latch: Grasps breast easily, tongue down, lips flanged, rhythmical sucking.  Audible Swallowing: Spontaneous and intermittent  Type of Nipple: Everted at rest and after stimulation  Comfort (Breast/Nipple): Filling, red/small blisters or bruises, mild/mod discomfort  Hold (Positioning): Assistance needed to correctly position infant at breast and maintain latch.  LATCH Score: 8  Interventions Interventions: Breast feeding basics reviewed;Assisted with latch;Skin to skin;Breast massage;Hand express;Breast compression;Adjust position;Support pillows;Position options;Hand pump     Consult Status Consult Status: Complete Date: 07/04/18 Follow-up type: Call as needed    [name] 07/04/2018, 12:59 PM

## 2018-07-04 NOTE — Discharge Summary (Signed)
Obstetric Discharge Summary Reason for Admission: induction of labor Prenatal Procedures: NST and ultrasound Intrapartum Procedures: spontaneous vaginal delivery Postpartum Procedures: none Complications-Operative and Postpartum: 2nd degree perineal laceration Hemoglobin  Date Value Ref Range Status  07/03/2018 9.3 (L) 12.0 - 15.0 g/dL Final    Comment:    DELTA CHECK NOTED REPEATED TO VERIFY    HCT  Date Value Ref Range Status  07/03/2018 26.6 (L) 36.0 - 46.0 % Final    Physical Exam:  General: alert, cooperative and appears stated age 32: appropriate Uterine Fundus: firm Incision: healing well DVT Evaluation: No evidence of DVT seen on physical exam.  Discharge Diagnoses: Term Pregnancy-delivered  Discharge Information: Date: 07/04/2018 Activity: pelvic rest Diet: routine Medications: Ibuprofen, Colace and Percocet Condition: improved Instructions: refer to practice specific booklet Discharge to: home Follow-up Information    Levi Aland, MD Follow up.   Specialty:  Obstetrics and Gynecology Contact information: [address] [place] [address] [phone]           Newborn Data: Live born female  Birth Weight: 7 lb 8.1 oz (3405 g) APGAR: 9, 9  Newborn Delivery   Birth date/time:  07/02/2018 22:37:00 Delivery type:  Vaginal, Vacuum (Extractor)     Home with mother.  Waynard Reeds 07/04/2018, 11:01 AM

## 2018-11-07 ENCOUNTER — Encounter

## 2018-11-13 ENCOUNTER — Ambulatory Visit: Payer: Self-pay | Admitting: Family Medicine

## 2019-05-02 ENCOUNTER — Telehealth: Payer: Self-pay | Admitting: Family Medicine

## 2019-05-02 NOTE — Telephone Encounter (Signed)
LVM to schedule appt for ear pressure per CRM

## 2019-05-25 IMAGING — US US MFM OB DETAIL+14 WK
1 series · 14 of 28 positions shown · non-contrast
Comparison: none

[Series 2: us mfm ob detail+14 wk · 14 of 86 slices shown]
[im 4/86]
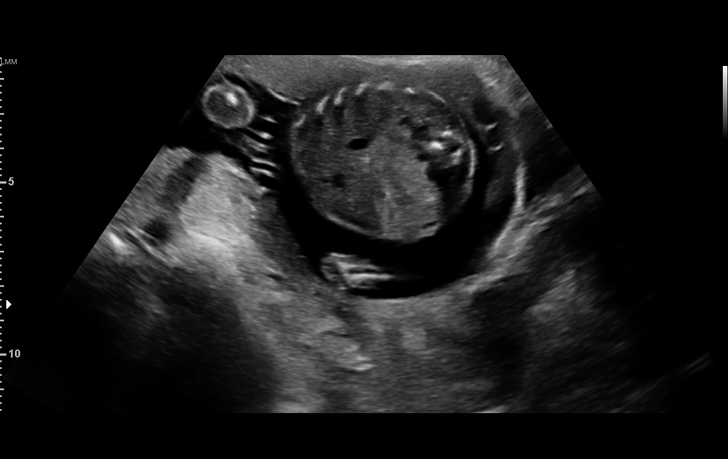
[im 10/86]
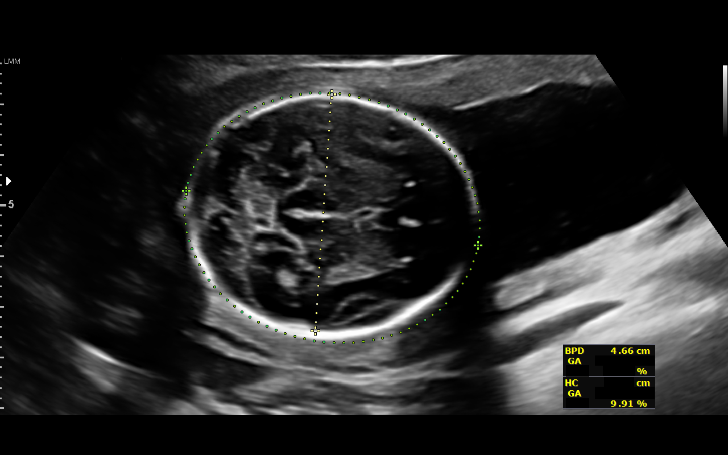
[im 16/86]
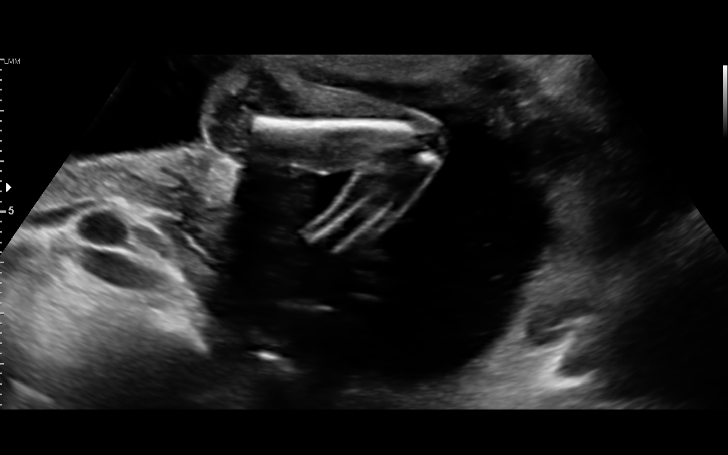
[im 23/86]
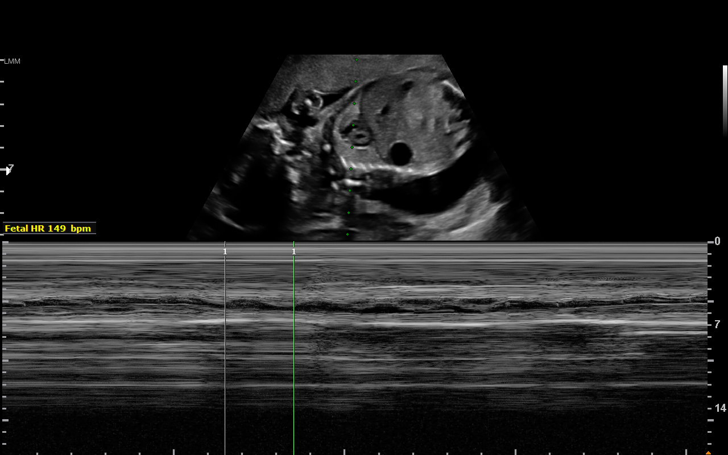
[im 29/86]
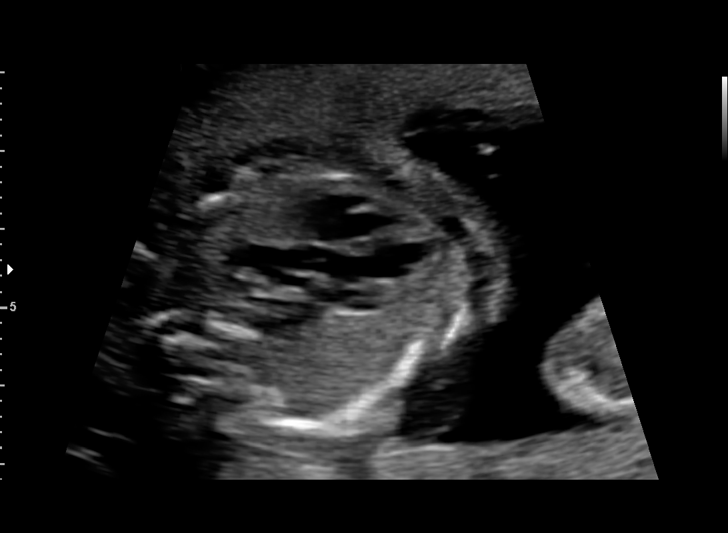
[im 35/86]
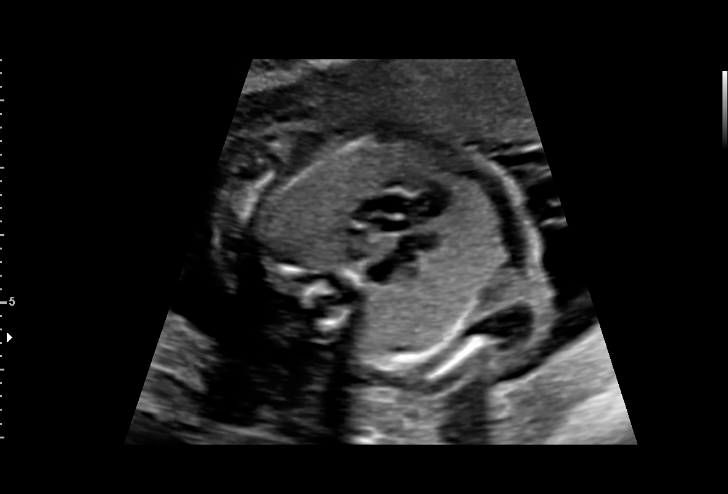
[im 41/86]
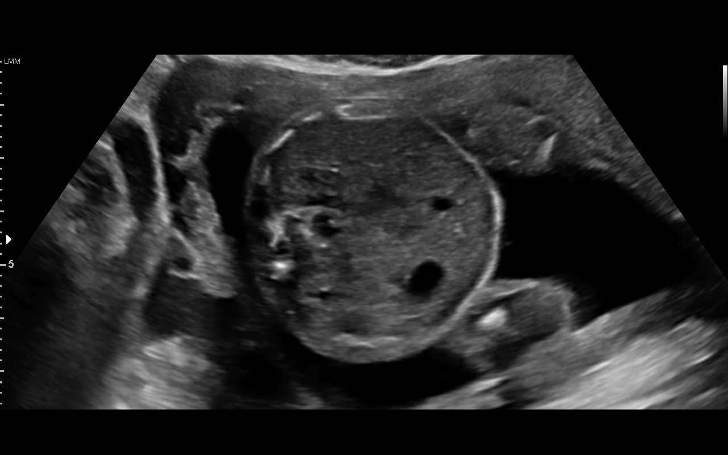
[im 48/86]
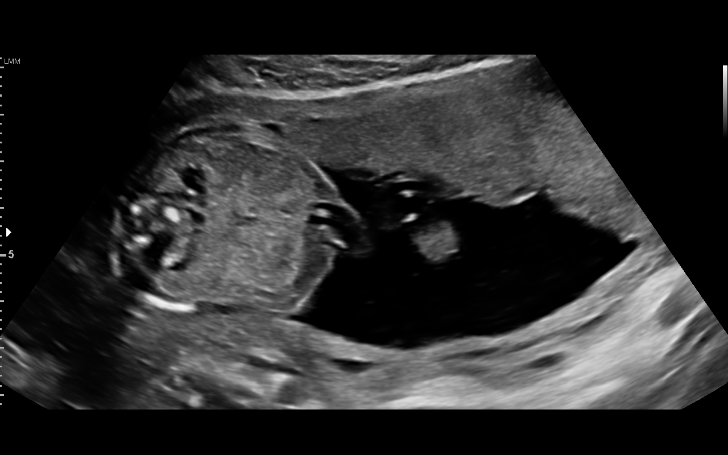
[im 54/86]
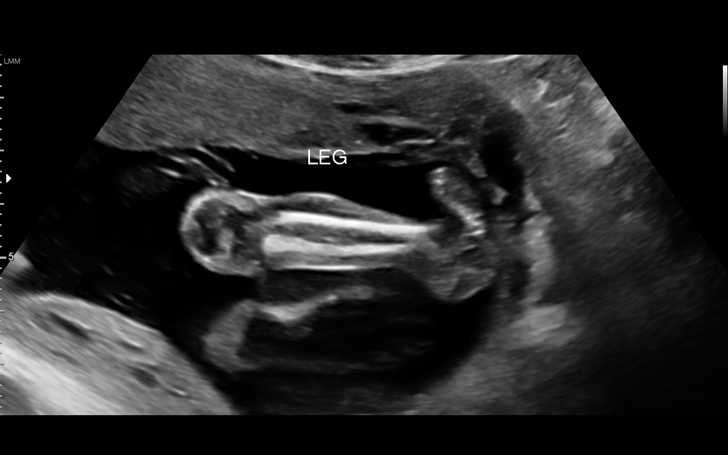
[im 60/86]
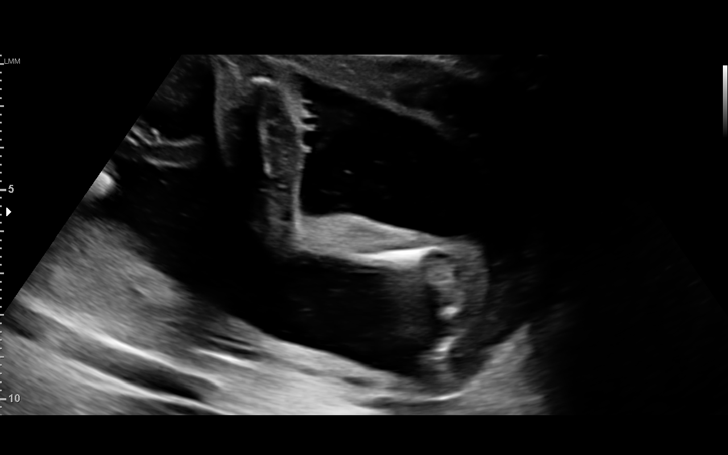
[im 67/86]
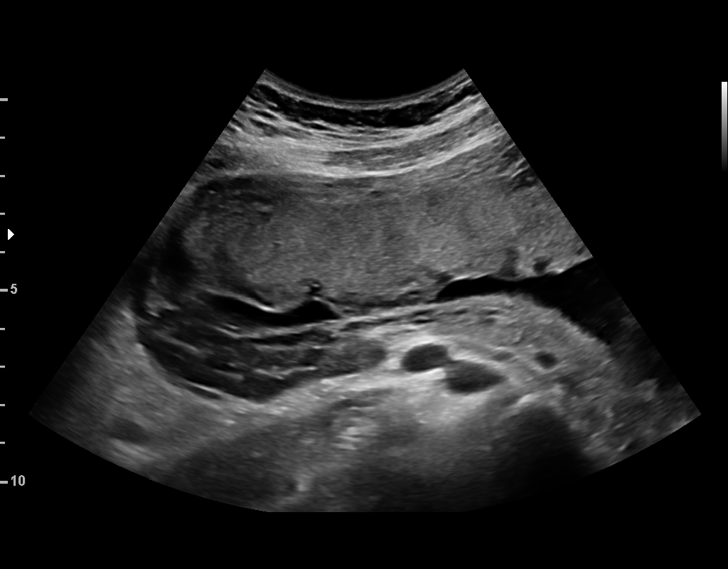
[im 73/86]
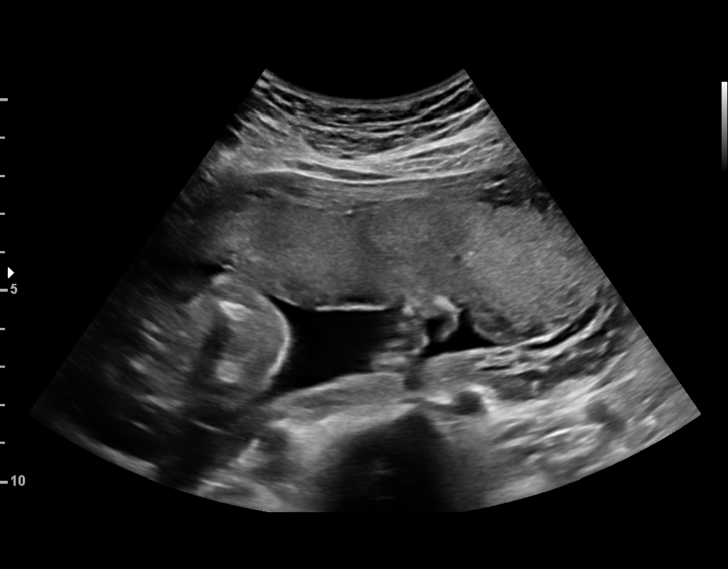
[im 79/86]
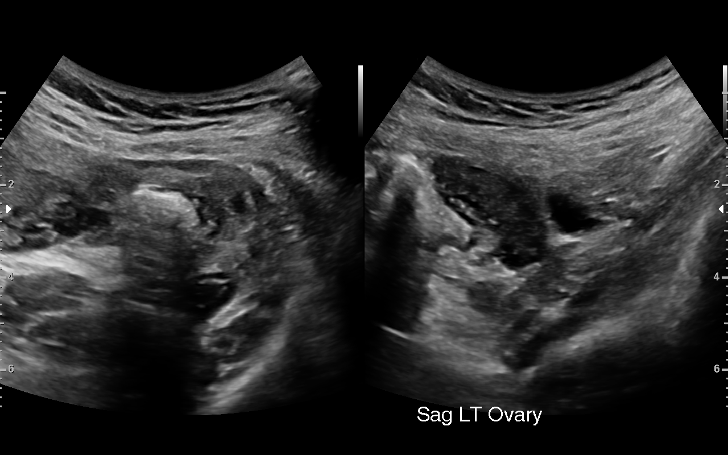
[im 86/86]
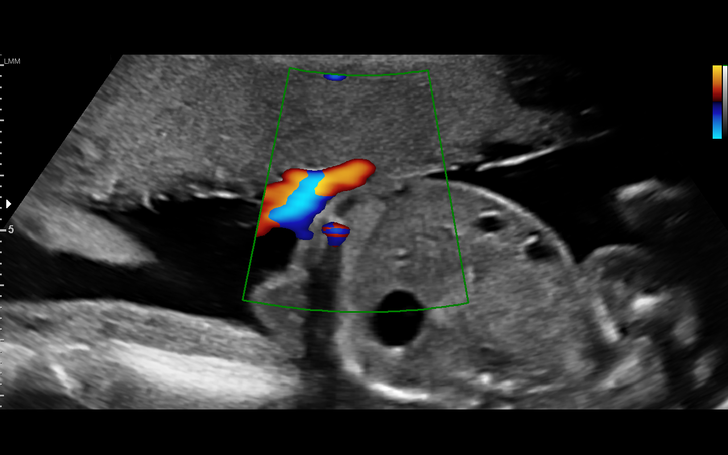

[14 of 28 positions shown; findings below may reference images not displayed]

BOARQUEIRO DO

1  LIZBETH MILAZZO          [id number]      [id number]     [id number]
Indications

20 weeks gestation of pregnancy
Encounter for fetal anatomic survey
Velamentous insertion of umbilical cord
Family history of congenital anomaly (patient
with clubfoot)
OB History

Gravidity:    1
Fetal Evaluation

Num Of Fetuses:     1
Fetal Heart         149
Rate(bpm):
Cardiac Activity:   Observed
Presentation:       Breech
Placenta:           Anterior, above cervical os
P. Cord Insertion:  Visualized, central

Amniotic Fluid
AFI FV:      Subjectively within normal limits

Largest Pocket(cm)
5.03
Biometry

BPD:      46.8  mm     G. Age:  20w 1d         37  %    CI:        75.42   %    70 - 86
FL/HC:      18.4   %    16.8 -
HC:      170.9  mm     G. Age:  19w 5d         14  %    HC/AC:      1.11        1.09 -
AC:      154.6  mm     G. Age:  20w 5d         51  %    FL/BPD:     67.3   %
FL:       31.5  mm     G. Age:  19w 6d         21  %    FL/AC:      20.4   %    20 - 24
HUM:      30.5  mm     G. Age:  20w 1d         41  %
CER:      20.4  mm     G. Age:  19w 3d         27  %
CM:          4  mm

Est. FW:     338  gm    0 lb 12 oz      42  %
Gestational Age

LMP:           19w 4d        Date:  09/24/17                 EDD:   07/01/18
Clinical EDD:  20w 3d                                        EDD:   06/25/18
U/S Today:     20w 1d                                        EDD:   06/27/18
Best:          20w 3d     Det. By:  Clinical EDD             EDD:   06/25/18
Anatomy

Cranium:               Appears normal         Aortic Arch:            Appears normal
Cavum:                 Appears normal         Ductal Arch:            Appears normal
Ventricles:            Appears normal         Diaphragm:              Appears normal
Choroid Plexus:        Appears normal         Stomach:                Appears normal, left
sided
Cerebellum:            Appears normal         Abdomen:                Appears normal
Posterior Fossa:       Appears normal         Abdominal Wall:         Appears nml (cord
insert, abd wall)
Nuchal Fold:           Not applicable (>20    Cord Vessels:           Appears normal (3
wks GA)                                        vessel cord)
Face:                  Appears normal         Kidneys:                Appear normal
(orbits and profile)
Lips:                  Appears normal         Bladder:                Appears normal
Thoracic:              Appears normal         Spine:                  Appears normal
Heart:                 Appears normal         Upper Extremities:      Appears normal
(4CH, axis, and situs
RVOT:                  Appears normal         Lower Extremities:      Appears normal
LVOT:                  Appears normal

Other:  Fetus appears to be a female. Heels and 5th digit appears normal.
Nasal bone visualized. PATIENTS DO NOT WANT TO KNOW
GENDER TODAY.
Cervix Uterus Adnexa

Cervix
Length:           3.99  cm.
Normal appearance by transabdominal scan.

Uterus
No abnormality visualized.

Left Ovary
Within normal limits.

Right Ovary
Not visualized.

Cul De Sac:   No free fluid seen.

Adnexa:       No abnormality visualized.
Impression

Singleton intrauterine pregnancy at 20+1 weeks with
suspected velementous insertion here for evaluation
Interval review of the anatomy shows no sonographic
markers for aneuploidy or structural anomalies
All relevant fetal anatomy has been visualized
Amniotic fluid volume is normal
Estimated fetal weight shows growth in the 42nd percentile
Cord insertion is central with no evidence of marginal or
velamentous insertion
Recommendations

Follow-up ultrasounds as clinically indicated.

## 2019-10-14 LAB — OB RESULTS CONSOLE GC/CHLAMYDIA
Chlamydia: NEGATIVE
Gonorrhea: NEGATIVE

## 2019-11-01 NOTE — L&D Delivery Note (Signed)
Patient was C/C/+2 and pushed for approx 10 minutes with epidural.    NSVD female infant, Apgars 8/9, weight 6#11.  Shoulder dystocia encountered after delivery of head, McRoberts performed without successful delivery with gentle downward traction, nuchal cord x 1 reduced and left hand at introitus grasped for delivery of posterior arm.  Right shoulder then delivered with gentle downward traction. The patient had no laceration. Fundus was firm. EBL was expected amount. Placenta was delivered intact. Vagina was clear.  Delayed cord clamping done for 30-60 seconds while warming baby.  NICU was in attendance. Baby was vigorous and doing skin to skin with mother.  Philip Aspen

## 2019-11-04 LAB — OB RESULTS CONSOLE RUBELLA ANTIBODY, IGM: Rubella: IMMUNE

## 2019-11-04 LAB — OB RESULTS CONSOLE HEPATITIS B SURFACE ANTIGEN: Hepatitis B Surface Ag: NEGATIVE

## 2019-11-04 LAB — OB RESULTS CONSOLE HIV ANTIBODY (ROUTINE TESTING): HIV: NONREACTIVE

## 2019-11-04 LAB — OB RESULTS CONSOLE RPR: RPR: NONREACTIVE

## 2020-05-12 ENCOUNTER — Encounter (HOSPITAL_COMMUNITY): Payer: Self-pay | Admitting: Obstetrics and Gynecology

## 2020-05-12 ENCOUNTER — Inpatient Hospital Stay (HOSPITAL_COMMUNITY)
Admission: AD | Admit: 2020-05-12 | Discharge: 2020-05-12 | Disposition: A | Discharge status: Home / Self Care | Payer: 59 | Source: Home / Self Care | Attending: Obstetrics and Gynecology | Admitting: Obstetrics and Gynecology

## 2020-05-12 ENCOUNTER — Other Ambulatory Visit: Payer: Self-pay

## 2020-05-12 DIAGNOSIS — Z3A39 39 weeks gestation of pregnancy: Secondary | ICD-10-CM

## 2020-05-12 DIAGNOSIS — O368130 Decreased fetal movements, third trimester, not applicable or unspecified: Secondary | ICD-10-CM

## 2020-05-12 DIAGNOSIS — O26893 Other specified pregnancy related conditions, third trimester: Secondary | ICD-10-CM | POA: Diagnosis not present

## 2020-05-12 NOTE — MAU Provider Note (Signed)
Chief Complaint:  Decreased Fetal Movement   First Provider Initiated Contact with Patient 05/12/20 1758     HPI: Jordan Mendez is a 34 y.o. G2P1001 at [redacted]w[redacted]d who presents to maternity admissions reporting decreased fetal movement since 4pm. Denies vaginal bleeding, leaking of fluid, decreased fetal movement, fever, falls, or recent illness.   Pregnancy Course: Normal  Past Medical History:  Diagnosis Date  . Medical history non-contributory    OB History  Gravida Para Term Preterm AB Living  2 1 1     1   SAB TAB Ectopic Multiple Live Births        0 1    # Outcome Date GA Lbr Len/2nd Weight Sex Delivery Anes PTL Lv  2 Current           1 Term 07/02/18 [redacted]w[redacted]d 25:31 / 00:25 7 lb 8.1 oz (3.405 kg) F Vag-Vacuum EPI  LIV   Past Surgical History:  Procedure Laterality Date  . BARTHOLIN CYST MARSUPIALIZATION    . CLUB FOOT RELEASE    . TONSILLECTOMY AND ADENOIDECTOMY     Family History  Problem Relation Age of Onset  . Diabetes Maternal Grandmother    Social History   Tobacco Use  . Smoking status: Never Smoker  . Smokeless tobacco: Never Used  Substance Use Topics  . Alcohol use: Not Currently  . Drug use: Never   No Known Allergies No medications prior to admission.    I have reviewed patient's Past Medical Hx, Surgical Hx, Family Hx, Social Hx, medications and allergies.   ROS:  Review of Systems  Constitutional: Negative.   HENT: Negative.   Eyes: Negative for photophobia and visual disturbance.  Respiratory: Negative for shortness of breath.   Cardiovascular: Negative.   Gastrointestinal: Negative for diarrhea, nausea and vomiting.  Endocrine: Negative.   Genitourinary: Negative for pelvic pain, vaginal bleeding and vaginal discharge.  Musculoskeletal: Negative.   Skin: Negative.   Allergic/Immunologic: Negative.   Neurological: Negative for dizziness, syncope, light-headedness and headaches.  Hematological: Negative.   Psychiatric/Behavioral: Negative.      Physical Exam   Patient Vitals for the past 24 hrs:  BP Temp Temp src Pulse Resp SpO2 Height Weight  05/12/20 1724 108/69 98.6 F (37 C) Oral 91 18 98 % 5\' 2"  (1.575 m) 142 lb 12.8 oz (64.8 kg)    Constitutional: Well-developed, well-nourished female in no acute distress.  Cardiovascular: normal rate & rhythm, no murmur Respiratory: normal effort, lung sounds clear throughout GI: Abd soft, non-tender, gravid appropriate for gestational age. Pos BS x 4. Movement palpated during exam. MS: Extremities nontender, no edema, normal ROM Neurologic: Alert and oriented x 4.  Pelvic exam deferred  Fetal Tracing: reactive Baseline: 135 Variability: moderate Accelerations: present Decelerations: none Toco: UI   MDM: NST reactive 39.[redacted]wks gestation, no contractions or cramping  Assessment: 1. Decreased fetal movements in third trimester, single or unspecified fetus   2. [redacted] weeks gestation of pregnancy     Plan: Discharge home in stable condition.   Follow-up Information    Ob/Gyn, 05/14/20. Go to.   Why: as scheduled Contact information: [address] [place] [address] [place] [phone]               Allergies as of 05/12/2020   No Known Allergies     Medication List    TAKE these medications   docusate sodium 100 MG capsule Commonly known as: Colace Take 1 capsule (100 mg total) by mouth 2 (  two) times daily.   ibuprofen 600 MG tablet Commonly known as: ADVIL Take 1 tablet (600 mg total) by mouth every 6 (six) hours as needed.   oxyCODONE-acetaminophen 5-325 MG tablet Commonly known as: PERCOCET/ROXICET Take 1-2 tablets by mouth every 6 (six) hours as needed for severe pain.   prenatal multivitamin Tabs tablet Take 1 tablet by mouth daily at 12 noon.       Bernerd Limbo, [place] 05/12/2020 7:58 PM

## 2020-05-12 NOTE — Discharge Instructions (Signed)
Evaluacin de los movimientos fetales Fetal Movement Counts Nombre del paciente: ________________________________________________ Fecha de parto estimada: ____________________ Qu es una evaluacin de los movimientos fetales?  Una evaluacin de los movimientos fetales es el registro del nmero de veces que siente que el beb se mueve durante un cierto perodo de tiempo. Esto tambin se puede denominar recuento de patadas fetales. Una evaluacin de movimientos fetales se recomienda a todas las embarazadas. Es posible que le indiquen que comience a evaluar los movimientos fetales desde la semana 28 de embarazo. Preste atencin cuando sienta que el beb est ms activo. Podr detectar los ciclos en que el beb duerme y est despierto. Tambin podr detectar que ciertas cosas hacen que su beb se mueva ms. Deber realizar una evaluacin de los movimientos fetales en las siguientes situaciones:  Cuando el beb est ms activo habitualmente.  A la misma hora, todos los das. Un buen momento para evaluar los movimientos fetales es cuando est descansando, despus de haber comido y bebido algo. Cmo debo contar los movimientos fetales? 1. Encuentre un lugar tranquilo y cmodo. Sintese o acustese de lado. 2. Anote la fecha, la hora de inicio y de finalizacin y la cantidad de movimientos que sinti entre esas dos horas. Lleve esta informacin a las visitas de control. 3. Anote la hora de inicio cuando sienta el primer movimiento. 4. Cuente las pataditas, revoloteos, chasquidos, vueltas o pinchazos. Debe sentir al menos 10movimientos. 5. Puede dejar de contar despus de haber sentido 10 movimientos o de haber contado durante 2horas. Anote la hora de finalizacin. 6. Si no siente 10movimientos en 2horas, comunquese con su mdico para obtener ms indicaciones. Es posible que el mdico quiera realizar estudios adicionales para evaluar el bienestar del beb. Comunquese con un mdico si:  Siente  menos de 10movimientos en 2horas.  El beb no se mueve tanto como suele hacerlo. Fecha: ____________ Hora de inicio: ____________ Hora de finalizacin: ____________ Movimientos: ____________ Fecha: ____________ Hora de inicio: ____________ Hora de finalizacin: ____________ Movimientos: ____________ Fecha: ____________ Hora de inicio: ____________ Hora de finalizacin: ____________ Movimientos: ____________ Fecha: ____________ Hora de inicio: ____________ Hora de finalizacin: ____________ Movimientos: ____________ Fecha: ____________ Hora de inicio: ____________ Hora de finalizacin: ____________ Movimientos: ____________ Fecha: ____________ Hora de inicio: ____________ Hora de finalizacin: ____________ Movimientos: ____________ Fecha: ____________ Hora de inicio: ____________ Hora de finalizacin: ____________ Movimientos: ____________ Fecha: ____________ Hora de inicio: ____________ Hora de finalizacin: ____________ Movimientos: ____________ Fecha: ____________ Hora de inicio: ____________ Hora de finalizacin: ____________ Movimientos: ____________ Esta informacin no tiene como fin reemplazar el consejo del mdico. Asegrese de hacerle al mdico cualquier pregunta que tenga. Document Revised: 08/13/2019 Document Reviewed: 08/13/2019 Elsevier Patient Education  2020 Else[employer] Inc[employer]  

## 2020-05-12 NOTE — MAU Note (Signed)
Saw Dr Claiborne Billings in the office, sent over for further eval, baby is not moving as much. Denies pain, bleeding or LOF.  Was 2 cm last wk.

## 2020-05-15 ENCOUNTER — Inpatient Hospital Stay (HOSPITAL_COMMUNITY): Payer: 59 | Admitting: Anesthesiology

## 2020-05-15 ENCOUNTER — Other Ambulatory Visit: Payer: Self-pay

## 2020-05-15 ENCOUNTER — Inpatient Hospital Stay (HOSPITAL_COMMUNITY)
Admission: AD | Admit: 2020-05-15 | Discharge: 2020-05-16 | DRG: 807 | Disposition: A | Discharge status: Home / Self Care | Payer: 59 | Attending: Obstetrics and Gynecology | Admitting: Obstetrics and Gynecology

## 2020-05-15 ENCOUNTER — Encounter (HOSPITAL_COMMUNITY): Payer: Self-pay | Admitting: Obstetrics and Gynecology

## 2020-05-15 DIAGNOSIS — O26893 Other specified pregnancy related conditions, third trimester: Secondary | ICD-10-CM | POA: Diagnosis present

## 2020-05-15 DIAGNOSIS — Z20822 Contact with and (suspected) exposure to covid-19: Secondary | ICD-10-CM | POA: Diagnosis present

## 2020-05-15 DIAGNOSIS — Z3A40 40 weeks gestation of pregnancy: Secondary | ICD-10-CM | POA: Diagnosis not present

## 2020-05-15 LAB — CBC
HCT: 39.1 % (ref 36.0–46.0)
Hemoglobin: 13 g/dL (ref 12.0–15.0)
MCH: 30 pg (ref 26.0–34.0)
MCHC: 33.2 g/dL (ref 30.0–36.0)
MCV: 90.1 fL (ref 80.0–100.0)
Platelets: 319 10*3/uL (ref 150–400)
RBC: 4.34 MIL/uL (ref 3.87–5.11)
RDW: 13.2 % (ref 11.5–15.5)
WBC: 10.6 10*3/uL — ABNORMAL HIGH (ref 4.0–10.5)
nRBC: 0 % (ref 0.0–0.2)

## 2020-05-15 LAB — SARS CORONAVIRUS 2 BY RT PCR (HOSPITAL ORDER, PERFORMED IN ~~LOC~~ HOSPITAL LAB): SARS Coronavirus 2: NEGATIVE

## 2020-05-15 LAB — TYPE AND SCREEN
ABO/RH(D): A POS
Antibody Screen: NEGATIVE

## 2020-05-15 LAB — OB RESULTS CONSOLE GBS: GBS: NEGATIVE

## 2020-05-15 LAB — RPR: RPR Ser Ql: NONREACTIVE

## 2020-05-15 MED ORDER — SENNOSIDES-DOCUSATE SODIUM 8.6-50 MG PO TABS
2.0000 | ORAL_TABLET | ORAL | Status: DC
  Administered 2020-05-15: 2 via ORAL
  Filled 2020-05-15: qty 2

## 2020-05-15 MED ORDER — LACTATED RINGERS IV SOLN
500.0000 mL | INTRAVENOUS | Status: DC | PRN
  Administered 2020-05-15: 1000 mL via INTRAVENOUS

## 2020-05-15 MED ORDER — OXYCODONE-ACETAMINOPHEN 5-325 MG PO TABS: 1.0000 | ORAL_TABLET | ORAL | Status: DC | PRN

## 2020-05-15 MED ORDER — LACTATED RINGERS IV SOLN: INTRAVENOUS | Status: DC

## 2020-05-15 MED ORDER — PHENYLEPHRINE 40 MCG/ML (10ML) SYRINGE FOR IV PUSH (FOR BLOOD PRESSURE SUPPORT)
80.0000 ug | PREFILLED_SYRINGE | INTRAVENOUS | Status: DC | PRN
  Administered 2020-05-15: 80 ug via INTRAVENOUS

## 2020-05-15 MED ORDER — ONDANSETRON HCL 4 MG/2ML IJ SOLN: 4.0000 mg | Freq: Four times a day (QID) | INTRAMUSCULAR | Status: DC | PRN

## 2020-05-15 MED ORDER — LIDOCAINE HCL (PF) 1 % IJ SOLN
INTRAMUSCULAR | Status: DC | PRN
  Administered 2020-05-15 (×2): 4 mL via EPIDURAL

## 2020-05-15 MED ORDER — LACTATED RINGERS IV SOLN: 500.0000 mL | Freq: Once | INTRAVENOUS | Status: DC

## 2020-05-15 MED ORDER — TETANUS-DIPHTH-ACELL PERTUSSIS 5-2.5-18.5 LF-MCG/0.5 IM SUSP: 0.5000 mL | Freq: Once | INTRAMUSCULAR | Status: DC

## 2020-05-15 MED ORDER — PHENYLEPHRINE 40 MCG/ML (10ML) SYRINGE FOR IV PUSH (FOR BLOOD PRESSURE SUPPORT)
PREFILLED_SYRINGE | INTRAVENOUS | Status: AC
  Filled 2020-05-15: qty 10

## 2020-05-15 MED ORDER — FLEET ENEMA 7-19 GM/118ML RE ENEM: 1.0000 | ENEMA | RECTAL | Status: DC | PRN

## 2020-05-15 MED ORDER — SOD CITRATE-CITRIC ACID 500-334 MG/5ML PO SOLN: 30.0000 mL | ORAL | Status: DC | PRN

## 2020-05-15 MED ORDER — DIBUCAINE (PERIANAL) 1 % EX OINT: 1.0000 "application " | TOPICAL_OINTMENT | CUTANEOUS | Status: DC | PRN

## 2020-05-15 MED ORDER — DIPHENHYDRAMINE HCL 25 MG PO CAPS: 25.0000 mg | ORAL_CAPSULE | Freq: Four times a day (QID) | ORAL | Status: DC | PRN

## 2020-05-15 MED ORDER — COCONUT OIL OIL: 1.0000 "application " | TOPICAL_OIL | Status: DC | PRN

## 2020-05-15 MED ORDER — FENTANYL-BUPIVACAINE-NACL 0.5-0.125-0.9 MG/250ML-% EP SOLN
EPIDURAL | Status: AC
  Filled 2020-05-15: qty 250

## 2020-05-15 MED ORDER — FENTANYL-BUPIVACAINE-NACL 0.5-0.125-0.9 MG/250ML-% EP SOLN: 12.0000 mL/h | EPIDURAL | Status: DC | PRN

## 2020-05-15 MED ORDER — SODIUM CHLORIDE (PF) 0.9 % IJ SOLN
INTRAMUSCULAR | Status: DC | PRN
  Administered 2020-05-15: 12 mL/h via EPIDURAL

## 2020-05-15 MED ORDER — SIMETHICONE 80 MG PO CHEW: 80.0000 mg | CHEWABLE_TABLET | ORAL | Status: DC | PRN

## 2020-05-15 MED ORDER — ACETAMINOPHEN 325 MG PO TABS: 650.0000 mg | ORAL_TABLET | ORAL | Status: DC | PRN

## 2020-05-15 MED ORDER — OXYTOCIN BOLUS FROM INFUSION
333.0000 mL | Freq: Once | INTRAVENOUS | Status: AC
  Administered 2020-05-15: 333 mL via INTRAVENOUS

## 2020-05-15 MED ORDER — OXYCODONE-ACETAMINOPHEN 5-325 MG PO TABS: 2.0000 | ORAL_TABLET | ORAL | Status: DC | PRN

## 2020-05-15 MED ORDER — BENZOCAINE-MENTHOL 20-0.5 % EX AERO: 1.0000 "application " | INHALATION_SPRAY | CUTANEOUS | Status: DC | PRN

## 2020-05-15 MED ORDER — IBUPROFEN 600 MG PO TABS
600.0000 mg | ORAL_TABLET | Freq: Four times a day (QID) | ORAL | Status: DC
  Administered 2020-05-15 – 2020-05-16 (×5): 600 mg via ORAL
  Filled 2020-05-15 (×5): qty 1

## 2020-05-15 MED ORDER — EPHEDRINE 5 MG/ML INJ: 10.0000 mg | INTRAVENOUS | Status: DC | PRN

## 2020-05-15 MED ORDER — OXYTOCIN-SODIUM CHLORIDE 30-0.9 UT/500ML-% IV SOLN
2.5000 [IU]/h | INTRAVENOUS | Status: DC
  Administered 2020-05-15: 2.5 [IU]/h via INTRAVENOUS
  Filled 2020-05-15: qty 500

## 2020-05-15 MED ORDER — DIPHENHYDRAMINE HCL 50 MG/ML IJ SOLN: 12.5000 mg | INTRAMUSCULAR | Status: DC | PRN

## 2020-05-15 MED ORDER — PRENATAL MULTIVITAMIN CH
1.0000 | ORAL_TABLET | Freq: Every day | ORAL | Status: DC
  Administered 2020-05-16: 1 via ORAL
  Filled 2020-05-15: qty 1

## 2020-05-15 MED ORDER — ZOLPIDEM TARTRATE 5 MG PO TABS: 5.0000 mg | ORAL_TABLET | Freq: Every evening | ORAL | Status: DC | PRN

## 2020-05-15 MED ORDER — ONDANSETRON HCL 4 MG PO TABS: 4.0000 mg | ORAL_TABLET | ORAL | Status: DC | PRN

## 2020-05-15 MED ORDER — PHENYLEPHRINE 40 MCG/ML (10ML) SYRINGE FOR IV PUSH (FOR BLOOD PRESSURE SUPPORT): 80.0000 ug | PREFILLED_SYRINGE | INTRAVENOUS | Status: DC | PRN

## 2020-05-15 MED ORDER — LIDOCAINE HCL (PF) 1 % IJ SOLN: 30.0000 mL | INTRAMUSCULAR | Status: DC | PRN

## 2020-05-15 MED ORDER — WITCH HAZEL-GLYCERIN EX PADS: 1.0000 "application " | MEDICATED_PAD | CUTANEOUS | Status: DC | PRN

## 2020-05-15 MED ORDER — ONDANSETRON HCL 4 MG/2ML IJ SOLN: 4.0000 mg | INTRAMUSCULAR | Status: DC | PRN

## 2020-05-15 NOTE — H&P (Signed)
34 y.o. [redacted]w[redacted]d  G2P1001 comes in c/o painful contractions.  Otherwise has good fetal movement and no bleeding.  Past Medical History:  Diagnosis Date  . Medical history non-contributory     Past Surgical History:  Procedure Laterality Date  . BARTHOLIN CYST MARSUPIALIZATION    . CLUB FOOT RELEASE    . TONSILLECTOMY AND ADENOIDECTOMY      OB History  Gravida Para Term Preterm AB Living  2 1 1     1   SAB TAB Ectopic Multiple Live Births        0 1    # Outcome Date GA Lbr Len/2nd Weight Sex Delivery Anes PTL Lv  2 Current           1 Term 07/02/18 [redacted]w[redacted]d 25:31 / 00:25 3405 g F Vag-Vacuum EPI  LIV    Social History   Socioeconomic History  . Marital status: Married    Spouse name: Not on file  . Number of children: Not on file  . Years of education: Not on file  . Highest education level: Not on file  Occupational History  . Not on file  Tobacco Use  . Smoking status: Never Smoker  . Smokeless tobacco: Never Used  Substance and Sexual Activity  . Alcohol use: Not Currently  . Drug use: Never  . Sexual activity: Yes    Birth control/protection: None  Other Topics Concern  . Not on file  Social History Narrative  . Not on file   Social Determinants of Health   Financial Resource Strain:   . Difficulty of Paying Living Expenses:   Food Insecurity:   . Worried About [redacted]w[redacted]d in the Last Year:   . Programme researcher, broadcasting/film/video in the Last Year:   Transportation Needs:   . Barista (Medical):   Freight forwarder Lack of Transportation (Non-Medical):   Physical Activity:   . Days of Exercise per Week:   . Minutes of Exercise per Session:   Stress:   . Feeling of Stress :   Social Connections:   . Frequency of Communication with Friends and Family:   . Frequency of Social Gatherings with Friends and Family:   . Attends Religious Services:   . Active Member of Clubs or Organizations:   . Attends [employer] Meetings:   Banker Marital Status:   Intimate Partner  Violence:   . Fear of Current or Ex-Partner:   . Emotionally Abused:   [employer] Physically Abused:   . Sexually Abused:    Patient has no known allergies.    Prenatal Transfer Tool  Maternal Diabetes: No Genetic Screening: Normal Maternal Ultrasounds/Referrals: Normal Fetal Ultrasounds or other Referrals:  None Maternal Substance Abuse:  No Significant Maternal Medications:  None Significant Maternal Lab Results: Group B Strep negative  Other PNC: uncomplicated.    There were no vitals filed for this visit.  Lungs/Cor:  NAD Abdomen:  soft, gravid Ex:  no cords, erythema SVE:  4.5/80/-2 FHTs:  155 by doppler, pt just put on monitor Toco:  pending   A/P   Admit with labor at 40.0  GBS Neg  Pt requests epidural, ok  Other routine care  05-14-1969

## 2020-05-15 NOTE — Anesthesia Preprocedure Evaluation (Signed)
Anesthesia Evaluation  Patient identified by MRN, date of birth, ID band Patient awake    Reviewed: Allergy & Precautions, Patient's Chart, lab work & pertinent test results  Airway Mallampati: II  TM Distance: >3 FB Neck ROM: Full    Dental no notable dental hx. (+) Teeth Intact   Pulmonary neg pulmonary ROS,    Pulmonary exam normal breath sounds clear to auscultation       Cardiovascular negative cardio ROS Normal cardiovascular exam Rhythm:Regular Rate:Normal     Neuro/Psych negative neurological ROS  negative psych ROS   GI/Hepatic Neg liver ROS, GERD  ,  Endo/Other  negative endocrine ROS  Renal/GU negative Renal ROS  negative genitourinary   Musculoskeletal negative musculoskeletal ROS (+)   Abdominal   Peds  Hematology negative hematology ROS (+)   Anesthesia Other Findings   Reproductive/Obstetrics (+) Pregnancy                             Anesthesia Physical  Anesthesia Plan  ASA: II  Anesthesia Plan: Epidural   Post-op Pain Management:    Induction:   PONV Risk Score and Plan:   Airway Management Planned: Natural Airway  Additional Equipment:   Intra-op Plan:   Post-operative Plan:   Informed Consent: I have reviewed the patients History and Physical, chart, labs and discussed the procedure including the risks, benefits and alternatives for the proposed anesthesia with the patient or authorized representative who has indicated his/her understanding and acceptance.       Plan Discussed with: Anesthesiologist  Anesthesia Plan Comments:         Anesthesia Quick Evaluation  

## 2020-05-15 NOTE — Progress Notes (Signed)
Pt comfortable with epidural but feeling shakey. FHT: 150 mod var +10x10 accels, one 15x15, no decels TOCO: q3-4 SVE: 8/90/0 A/P: 40.0 labor AROM moderate meconium, overall reassuring fetal tracing advised RN to have NICU present for delivery. Cont. Current mgmt

## 2020-05-15 NOTE — Anesthesia Procedure Notes (Signed)

## 2020-05-15 NOTE — MAU Note (Signed)
Pt presents for labor , uc's q 1-2 minutes. Denies ROM

## 2020-05-16 LAB — CBC
HCT: 31.7 % — ABNORMAL LOW (ref 36.0–46.0)
Hemoglobin: 10.3 g/dL — ABNORMAL LOW (ref 12.0–15.0)
MCH: 30.4 pg (ref 26.0–34.0)
MCHC: 32.5 g/dL (ref 30.0–36.0)
MCV: 93.5 fL (ref 80.0–100.0)
Platelets: 246 10*3/uL (ref 150–400)
RBC: 3.39 MIL/uL — ABNORMAL LOW (ref 3.87–5.11)
RDW: 13.4 % (ref 11.5–15.5)
WBC: 9.8 10*3/uL (ref 4.0–10.5)
nRBC: 0 % (ref 0.0–0.2)

## 2020-05-16 NOTE — Progress Notes (Signed)
Patient is eating, ambulating, voiding.  Pain control is good.  Appropriate lochia, no complaints.  Vitals:   05/15/20 1525 05/15/20 1920 05/15/20 2315 05/16/20 0400  BP: 112/68 105/70 121/71 95/63  Pulse: 97 75 74 84  Resp: 16 18 18 18   Temp: 98.6 F (37 C)  97.6 F (36.4 C) 98.2 F (36.8 C)  TempSrc: Oral  Oral Oral  SpO2: 93% 96% 98% 96%  Weight:      Height:        Fundus firm Abd: soft, nontender Ext: no calf tenderness  Lab Results  Component Value Date   WBC 9.8 05/16/2020   HGB 10.3 (L) 05/16/2020   HCT 31.7 (L) 05/16/2020   MCV 93.5 05/16/2020   PLT 246 05/16/2020    --/--/A POS (07/16 11-28-2005)  A/P Post partum day 1. Doing well. Desires d/c home after 24 hrs from delivery Will await ok by peds and anticipate d/c at 12pm today  Routine care.     [id number]

## 2020-05-16 NOTE — Discharge Summary (Signed)
Postpartum Discharge Summary    Patient Name: Jordan Mendez DOB: [birth year] MRN: [id number]  Date of admission: 05/15/2020 Delivery date:05/15/2020  Delivering provider: Allyn Kenner  Date of discharge: 05/16/2020  Admitting diagnosis: Indication for care in labor or delivery [O75.9] Intrauterine pregnancy: [redacted]w[redacted]d    Secondary diagnosis:  Active Problems:   Indication for care in labor or delivery  Additional problems: meconium    Discharge diagnosis: Term Pregnancy Delivered                                              Post partum procedures:none Augmentation: AROM Complications: None  Hospital course: Onset of Labor With Vaginal Delivery      34y.o. yo G2P2002 at 418w0das admitted in Active Labor on 05/15/2020. Patient had an uncomplicated labor course as follows:  Membrane Rupture Time/Date: 10:39 AM ,05/15/2020   Delivery Method:Vaginal, Spontaneous  Episiotomy: None  Lacerations:  None  Patient had an uncomplicated postpartum course.  She is ambulating, tolerating a regular diet, passing flatus, and urinating well. Patient is discharged home in stable condition on 05/16/20.  Newborn Data: Birth date:05/15/2020  Birth time:12:00 PM  Gender:Female  Living status:Living  Apgars:8 ,9  Weight:3050 g   Magnesium Sulfate received: No BMZ received: No Rhophylac:No MMR:N/A T-DaP:see office record Flu: N/A Transfusion:No  Physical exam  Vitals:   05/15/20 1525 05/15/20 1920 05/15/20 2315 05/16/20 0400  BP: 112/68 105/70 121/71 95/63  Pulse: 97 75 74 84  Resp: '16 18 18 18  '$ Temp: 98.6 F (37 C)  97.6 F (36.4 C) 98.2 F (36.8 C)  TempSrc: Oral  Oral Oral  SpO2: 93% 96% 98% 96%  Weight:      Height:       General: alert, cooperative and no distress Lochia: appropriate Uterine Fundus: firm DVT Evaluation: No evidence of DVT seen on physical exam. Labs: Lab Results  Component Value Date   WBC 9.8 05/16/2020   HGB 10.3 (L) 05/16/2020   HCT 31.7 (L)  05/16/2020   MCV 93.5 05/16/2020   PLT 246 05/16/2020   No flowsheet data found. Edinburgh Score: Edinburgh Postnatal Depression Scale Screening Tool 07/04/2018  I have been able to laugh and see the funny side of things. 1  I have looked forward with enjoyment to things. 0  I have blamed myself unnecessarily when things went wrong. 0  I have been anxious or worried for no good reason. 0  I have felt scared or panicky for no good reason. 0  Things have been getting on top of me. 0  I have been so unhappy that I have had difficulty sleeping. 0  I have felt sad or miserable. 0  I have been so unhappy that I have been crying. 0  The thought of harming myself has occurred to me. 0  Edinburgh Postnatal Depression Scale Total 1      After visit meds:  Allergies as of 05/16/2020   No Known Allergies     Medication List    STOP taking these medications   docusate sodium 100 MG capsule Commonly known as: Colace   ibuprofen 600 MG tablet Commonly known as: ADVIL   oxyCODONE-acetaminophen 5-325 MG tablet Commonly known as: PERCOCET/ROXICET   prenatal multivitamin Tabs tablet        Discharge home in stable condition Infant Feeding: see peds  Infant Disposition:home with mother Discharge instruction: per After Visit Summary and Postpartum booklet. Activity: Advance as tolerated. Pelvic rest for 6 weeks.  Diet: routine diet Anticipated Birth Control: Unsure Postpartum Appointment:4 weeks Additional Postpartum F/U: Postpartum Depression checkup Future Appointments:No future appointments. Follow up Visit:  Follow-up Information    Allyn Kenner, DO Follow up in 4 week(s).   Specialty: Obstetrics and Gynecology Contact information: [address] [place] [address] [phone]                   05/16/2020 Allyn Kenner, DO

## 2020-05-16 NOTE — Anesthesia Postprocedure Evaluation (Signed)
Anesthesia Post Note  Patient: Jordan Mendez  Procedure(s) Performed: AN AD HOC LABOR EPIDURAL     Anesthesia Type: Epidural   No complications documented.  Last Vitals:  Vitals:   05/15/20 2315 05/16/20 0400  BP: 121/71 95/63  Pulse: 74 84  Resp: 18 18  Temp: 36.4 C 36.8 C  SpO2: 98% 96%    Last Pain:  Vitals:   05/16/20 0742  TempSrc:   PainSc: 0-No pain   Pain Goal:                   Rica Records

## 2020-05-16 NOTE — Anesthesia Postprocedure Evaluation (Signed)
Anesthesia Post Note  Patient: Jordan Mendez  Procedure(s) Performed: AN AD HOC LABOR EPIDURAL     Patient location during evaluation: Mother Baby Anesthesia Type: Epidural Level of consciousness: awake and alert Pain management: pain level controlled Vital Signs Assessment: post-procedure vital signs reviewed and stable Respiratory status: spontaneous breathing, nonlabored ventilation and respiratory function stable Cardiovascular status: stable Postop Assessment: no headache, no backache and epidural receding Anesthetic complications: no   No complications documented.  Last Vitals:  Vitals:   05/15/20 2315 05/16/20 0400  BP: 121/71 95/63  Pulse: 74 84  Resp: 18 18  Temp: 36.4 C 36.8 C  SpO2: 98% 96%    Last Pain:  Vitals:   05/16/20 0742  TempSrc:   PainSc: 0-No pain   Pain Goal:                   Rica Records

## 2020-05-16 NOTE — Lactation Note (Signed)
This note was copied from a baby's chart. Lactation Consultation Note  Patient Name: Jordan Mendez [id number] Date: 05/16/2020 Reason for consult: Initial assessment    NP desired LC to see family prior to DC.  Mom latching infant when [facility] entered room. Infant actively sucking.  LC provided pillows for infant and mom for better support and to bring infant closer into the breast.    LC observed last 10 minutes of 25 minute feeding.  Gape wide, lips flanged, and cheeks were touching breast after position adjustment.   LC did encourage mom to use compression and massage to breast during feed.  Audible swallows more pronounced and frequent when doing so.   BF basics reviewed with family.  8-12+feed in 24 hours, supply and demand, and cluster feedings reviewed.    Family was given a manual pump and parts explained and reviewed.  Flange size appropriate.  BF brochure provided and mom and dad are aware of lactation support groups, phone line, and OP LC appt.   Maternal Data Has patient been taught Hand Expression?: Yes Does the patient have breastfeeding experience prior to this delivery?: Yes  Feeding Feeding Type: Breast Fed  LATCH Score Latch: Grasps breast easily, tongue down, lips flanged, rhythmical sucking.  Audible Swallowing: Spontaneous and intermittent  Type of Nipple: Everted at rest and after stimulation  Comfort (Breast/Nipple): Soft / non-tender  Hold (Positioning): Assistance needed to correctly position infant at breast and maintain latch.  LATCH Score: 9  Interventions Interventions: Breast feeding basics reviewed;Assisted with latch;Skin to skin;Breast massage;Hand express;Position options;Support pillows;Adjust position;Hand pump  Lactation Tools Discussed/Used Pump Review: Setup, frequency, and cleaning Initiated by:: kb Date initiated:: 05/16/20   Consult Status Consult Status: Complete    Jordan Mendez [facility] 05/16/2020, 4:33 PM

## 2021-09-13 LAB — OB RESULTS CONSOLE GC/CHLAMYDIA
Chlamydia: NEGATIVE
Neisseria Gonorrhea: NEGATIVE

## 2021-10-04 LAB — OB RESULTS CONSOLE HIV ANTIBODY (ROUTINE TESTING): HIV: NONREACTIVE

## 2021-10-04 LAB — OB RESULTS CONSOLE HEPATITIS B SURFACE ANTIGEN: Hepatitis B Surface Ag: NEGATIVE

## 2021-10-04 LAB — OB RESULTS CONSOLE RUBELLA ANTIBODY, IGM: Rubella: IMMUNE

## 2021-10-04 LAB — OB RESULTS CONSOLE ABO/RH: RH Type: POSITIVE

## 2021-10-04 LAB — OB RESULTS CONSOLE RPR: RPR: NONREACTIVE

## 2021-10-04 LAB — HEPATITIS C ANTIBODY: HCV Ab: NEGATIVE

## 2021-10-04 LAB — OB RESULTS CONSOLE ANTIBODY SCREEN: Antibody Screen: NEGATIVE

## 2021-10-31 NOTE — L&D Delivery Note (Signed)
Patient was C/C/0 and pushed for approx 15  minutes with epidural.   After delivery of the head, shoulders were not immediately forthcoming with gentle downward pressure and McRoberts.  Right arm located posteriorly and posterior should delivered manually followed by anterior shoulder.  Infant had poor tone, but good heart rate.  Cord was clamped and cut immediately and infant was handed off toe neonatology.  Were she perked up quickly.   NSVD  female infant, Apgars 7/9, weight pending.   The patient had no laceration. Fundus was firm. EBL was expected amount. Placenta was delivered intact. Vagina was clear.  Baby was vigorous and doing skin to skin with mother.  Philip Aspen

## 2021-11-23 ENCOUNTER — Ambulatory Visit: Payer: Self-pay

## 2021-12-04 ENCOUNTER — Telehealth: Payer: 59 | Admitting: Physician Assistant

## 2021-12-04 DIAGNOSIS — Z3A19 19 weeks gestation of pregnancy: Secondary | ICD-10-CM | POA: Diagnosis not present

## 2021-12-04 DIAGNOSIS — J0110 Acute frontal sinusitis, unspecified: Secondary | ICD-10-CM

## 2021-12-04 DIAGNOSIS — Z3492 Encounter for supervision of normal pregnancy, unspecified, second trimester: Secondary | ICD-10-CM | POA: Insufficient documentation

## 2021-12-04 MED ORDER — AMOXICILLIN 875 MG PO TABS: 875.0000 mg | ORAL_TABLET | Freq: Two times a day (BID) | ORAL | 0 refills | Status: AC

## 2021-12-04 NOTE — Patient Instructions (Signed)
It was great to see you!  Start amoxicillin antibiotic  Push fluids and get plenty of rest. Please return if you are not improving as expected, or if you have high fevers (>101.5) or difficulty swallowing or worsening productive cough.  I hope you start feeling better soon!   Take care,  [name] PA-C

## 2021-12-04 NOTE — Progress Notes (Signed)
Virtual Visit Consent   Jordan Mendez, you are scheduled for a virtual visit with a Jordan Mendez provider today.     Just as with appointments in the office, your consent must be obtained to participate.  Your consent will be active for this visit and any virtual visit you may have with one of our providers in the next 365 days.     If you have a MyChart account, a copy of this consent can be sent to you electronically.  All virtual visits are billed to your insurance company just like a traditional visit in the office.    As this is a virtual visit, video technology does not allow for your provider to perform a traditional examination.  This may limit your provider's ability to fully assess your condition.  If your provider identifies any concerns that need to be evaluated in person or the need to arrange testing (such as labs, EKG, etc.), we will make arrangements to do so.     Although advances in technology are sophisticated, we cannot ensure that it will always work on either your end or our end.  If the connection with a video visit is poor, the visit may have to be switched to a telephone visit.  With either a video or telephone visit, we are not always able to ensure that we have a secure connection.     I need to obtain your verbal consent now.   Are you willing to proceed with your visit today?    [name] has provided verbal consent on 12/04/2021 for a virtual visit (video or telephone).   Jordan Mendez, [place]   Date: 12/04/2021 9:48 AM   Virtual Visit via Video Note   I, Jordan Mendez, connected with  Jordan Mendez  ([id number], [birth year]) on 12/04/21 at  9:30 AM EST by a video-enabled telemedicine application and verified that I am speaking with the correct person using two identifiers.  Location: Patient: Virtual Visit Location Patient: Home Provider: Virtual Visit Location Provider: Office/Clinic   I discussed the limitations of evaluation and management by telemedicine and  the availability of in person appointments. The patient expressed understanding and agreed to proceed.    History of Present Illness: Jordan Mendez is a 36 y.o. who identifies as a female who was assigned female at birth, and is being seen today for cough.  Video visit was used with virtual spanish interpreter  HPI: HPI   Nasal congestion In December, patient had cough, congestion. Went to [place] and symptoms improved in the warmer climate. When she returned from [place] in January, her symptoms returned -- ongoing cough and congestion. Symptoms two week ago significant worsened with headaches, decreased appetite and fatigue.  She is currently [redacted] weeks pregnant  She has tried: Mucinex, nasal sprays, benadryl, tylenol  She has not performed covid-19 test. Husband with same sx and got abx yesterday.  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  Resting comfortably  at home.  Head is normocephalic, atraumatic.  No labored breathing.  Speech is clear and coherent with logical content.  Patient is alert and oriented at baseline.    Assessment and Plan: Acute non-recurrent frontal sinusitis; [redacted] weeks gestation of pregnancy No red flags on discussion Suspect sinusitis, however I did instruct her to take a COVID-19 test and if this is positive to seek in-person care should symptoms worsen Amoxicillin 875 mg BID x 5 days sent in for  patient Encouraged hydration and good nutrition, rest  Monitor symptoms, if worsens, needs in office visit -- she verbalized understanding  Follow Up Instructions: I discussed the assessment and treatment plan with the patient. The patient was provided an opportunity to ask questions and all were answered. The patient agreed with the plan and demonstrated an understanding of the instructions.  A copy of instructions were sent to the patient via MyChart unless otherwise noted below.   The patient was advised to call back or seek an in-person evaluation if the symptoms worsen or if the condition fails to improve as anticipated.  Time:  I spent 15 minutes with the patient via telehealth technology discussing the above problems/concerns.    Jordan Mendez, [place]

## 2022-03-31 LAB — OB RESULTS CONSOLE GBS: GBS: NEGATIVE

## 2022-04-18 ENCOUNTER — Encounter (HOSPITAL_COMMUNITY): Payer: Self-pay | Admitting: *Deleted

## 2022-04-18 ENCOUNTER — Encounter (HOSPITAL_COMMUNITY): Payer: Self-pay

## 2022-04-18 ENCOUNTER — Telehealth (HOSPITAL_COMMUNITY): Payer: Self-pay | Admitting: *Deleted

## 2022-04-18 NOTE — Telephone Encounter (Signed)
Preadmission screen  

## 2022-04-19 ENCOUNTER — Telehealth (HOSPITAL_COMMUNITY): Payer: Self-pay | Admitting: *Deleted

## 2022-04-19 NOTE — Telephone Encounter (Signed)
Preadmission screen  

## 2022-04-20 ENCOUNTER — Telehealth (HOSPITAL_COMMUNITY): Payer: Self-pay | Admitting: *Deleted

## 2022-04-20 NOTE — Telephone Encounter (Signed)
Preadmission screen  

## 2022-04-21 ENCOUNTER — Telehealth (HOSPITAL_COMMUNITY): Payer: Self-pay | Admitting: *Deleted

## 2022-04-21 ENCOUNTER — Encounter (HOSPITAL_COMMUNITY): Payer: Self-pay | Admitting: *Deleted

## 2022-04-21 NOTE — Telephone Encounter (Signed)
Preadmission screen  

## 2022-04-26 ENCOUNTER — Inpatient Hospital Stay (HOSPITAL_COMMUNITY): Payer: Commercial Managed Care - PPO | Admitting: Anesthesiology

## 2022-04-26 ENCOUNTER — Inpatient Hospital Stay (HOSPITAL_COMMUNITY)
Admission: AD | Admit: 2022-04-26 | Discharge: 2022-04-28 | DRG: 807 | Disposition: A | Discharge status: Home / Self Care | Payer: Commercial Managed Care - PPO | Attending: Obstetrics and Gynecology | Admitting: Obstetrics and Gynecology

## 2022-04-26 ENCOUNTER — Inpatient Hospital Stay (HOSPITAL_COMMUNITY): Admit: 2022-04-26 | Payer: Self-pay

## 2022-04-26 ENCOUNTER — Inpatient Hospital Stay (HOSPITAL_COMMUNITY): Payer: Commercial Managed Care - PPO

## 2022-04-26 ENCOUNTER — Encounter (HOSPITAL_COMMUNITY): Payer: Self-pay | Admitting: Obstetrics and Gynecology

## 2022-04-26 ENCOUNTER — Other Ambulatory Visit: Payer: Self-pay

## 2022-04-26 DIAGNOSIS — O26893 Other specified pregnancy related conditions, third trimester: Secondary | ICD-10-CM | POA: Diagnosis present

## 2022-04-26 DIAGNOSIS — Z3A40 40 weeks gestation of pregnancy: Secondary | ICD-10-CM | POA: Diagnosis not present

## 2022-04-26 DIAGNOSIS — Z3490 Encounter for supervision of normal pregnancy, unspecified, unspecified trimester: Principal | ICD-10-CM

## 2022-04-26 LAB — CBC
HCT: 32.3 % — ABNORMAL LOW (ref 36.0–46.0)
Hemoglobin: 11.2 g/dL — ABNORMAL LOW (ref 12.0–15.0)
MCH: 31 pg (ref 26.0–34.0)
MCHC: 34.7 g/dL (ref 30.0–36.0)
MCV: 89.5 fL (ref 80.0–100.0)
Platelets: 255 10*3/uL (ref 150–400)
RBC: 3.61 MIL/uL — ABNORMAL LOW (ref 3.87–5.11)
RDW: 13.3 % (ref 11.5–15.5)
WBC: 6.4 10*3/uL (ref 4.0–10.5)
nRBC: 0 % (ref 0.0–0.2)

## 2022-04-26 LAB — TYPE AND SCREEN
ABO/RH(D): A POS
Antibody Screen: NEGATIVE

## 2022-04-26 MED ORDER — OXYTOCIN BOLUS FROM INFUSION
333.0000 mL | Freq: Once | INTRAVENOUS | Status: AC
  Administered 2022-04-27: 333 mL via INTRAVENOUS

## 2022-04-26 MED ORDER — DIPHENHYDRAMINE HCL 50 MG/ML IJ SOLN
12.5000 mg | INTRAMUSCULAR | Status: DC | PRN
  Administered 2022-04-26 (×2): 12.5 mg via INTRAVENOUS
  Filled 2022-04-26 (×2): qty 1

## 2022-04-26 MED ORDER — PHENYLEPHRINE 80 MCG/ML (10ML) SYRINGE FOR IV PUSH (FOR BLOOD PRESSURE SUPPORT)
80.0000 ug | PREFILLED_SYRINGE | INTRAVENOUS | Status: DC | PRN
  Administered 2022-04-26: 80 ug via INTRAVENOUS

## 2022-04-26 MED ORDER — LACTATED RINGERS IV SOLN: INTRAVENOUS | Status: DC

## 2022-04-26 MED ORDER — LIDOCAINE HCL (PF) 1 % IJ SOLN: 30.0000 mL | INTRAMUSCULAR | Status: DC | PRN

## 2022-04-26 MED ORDER — LIDOCAINE HCL (PF) 1 % IJ SOLN
INTRAMUSCULAR | Status: DC | PRN
  Administered 2022-04-26: 3 mL via EPIDURAL

## 2022-04-26 MED ORDER — LACTATED RINGERS IV SOLN: 500.0000 mL | INTRAVENOUS | Status: DC | PRN

## 2022-04-26 MED ORDER — OXYCODONE-ACETAMINOPHEN 5-325 MG PO TABS: 2.0000 | ORAL_TABLET | ORAL | Status: DC | PRN

## 2022-04-26 MED ORDER — EPHEDRINE 5 MG/ML INJ: 10.0000 mg | INTRAVENOUS | Status: DC | PRN

## 2022-04-26 MED ORDER — OXYTOCIN-SODIUM CHLORIDE 30-0.9 UT/500ML-% IV SOLN
2.5000 [IU]/h | INTRAVENOUS | Status: DC
  Administered 2022-04-27: 2.5 [IU]/h via INTRAVENOUS

## 2022-04-26 MED ORDER — TERBUTALINE SULFATE 1 MG/ML IJ SOLN: 0.2500 mg | Freq: Once | INTRAMUSCULAR | Status: DC | PRN

## 2022-04-26 MED ORDER — ACETAMINOPHEN 325 MG PO TABS: 650.0000 mg | ORAL_TABLET | ORAL | Status: DC | PRN

## 2022-04-26 MED ORDER — ONDANSETRON HCL 4 MG/2ML IJ SOLN: 4.0000 mg | Freq: Four times a day (QID) | INTRAMUSCULAR | Status: DC | PRN

## 2022-04-26 MED ORDER — LACTATED RINGERS IV SOLN: 500.0000 mL | Freq: Once | INTRAVENOUS | Status: DC

## 2022-04-26 MED ORDER — OXYCODONE-ACETAMINOPHEN 5-325 MG PO TABS: 1.0000 | ORAL_TABLET | ORAL | Status: DC | PRN

## 2022-04-26 MED ORDER — EPHEDRINE 5 MG/ML INJ
10.0000 mg | INTRAVENOUS | Status: DC | PRN
Start: 2022-04-26 — End: 2022-04-27

## 2022-04-26 MED ORDER — SOD CITRATE-CITRIC ACID 500-334 MG/5ML PO SOLN: 30.0000 mL | ORAL | Status: DC | PRN

## 2022-04-26 MED ORDER — FENTANYL-BUPIVACAINE-NACL 0.5-0.125-0.9 MG/250ML-% EP SOLN
12.0000 mL/h | EPIDURAL | Status: DC | PRN
  Administered 2022-04-26: 12 mL/h via EPIDURAL
  Filled 2022-04-26: qty 250

## 2022-04-26 MED ORDER — LIDOCAINE-EPINEPHRINE (PF) 1.5 %-1:200000 IJ SOLN
INTRAMUSCULAR | Status: DC | PRN
  Administered 2022-04-26: 3 mL via EPIDURAL

## 2022-04-26 MED ORDER — OXYTOCIN-SODIUM CHLORIDE 30-0.9 UT/500ML-% IV SOLN
1.0000 m[IU]/min | INTRAVENOUS | Status: DC
  Administered 2022-04-26: 2 m[IU]/min via INTRAVENOUS
  Filled 2022-04-26: qty 500

## 2022-04-26 MED ORDER — PHENYLEPHRINE 80 MCG/ML (10ML) SYRINGE FOR IV PUSH (FOR BLOOD PRESSURE SUPPORT)
80.0000 ug | PREFILLED_SYRINGE | INTRAVENOUS | Status: DC | PRN
  Filled 2022-04-26: qty 10

## 2022-04-27 ENCOUNTER — Encounter (HOSPITAL_COMMUNITY): Payer: Self-pay | Admitting: Obstetrics and Gynecology

## 2022-04-27 LAB — RPR: RPR Ser Ql: NONREACTIVE

## 2022-04-27 MED ORDER — ONDANSETRON HCL 4 MG/2ML IJ SOLN: 4.0000 mg | INTRAMUSCULAR | Status: DC | PRN

## 2022-04-27 MED ORDER — IBUPROFEN 600 MG PO TABS
600.0000 mg | ORAL_TABLET | Freq: Four times a day (QID) | ORAL | Status: DC
  Administered 2022-04-27 – 2022-04-28 (×4): 600 mg via ORAL
  Filled 2022-04-27 (×4): qty 1

## 2022-04-27 MED ORDER — BENZOCAINE-MENTHOL 20-0.5 % EX AERO
1.0000 | INHALATION_SPRAY | CUTANEOUS | Status: DC | PRN
Start: 2022-04-27 — End: 2022-04-28
  Filled 2022-04-27: qty 56

## 2022-04-27 MED ORDER — ONDANSETRON HCL 4 MG PO TABS: 4.0000 mg | ORAL_TABLET | ORAL | Status: DC | PRN

## 2022-04-27 MED ORDER — ACETAMINOPHEN 325 MG PO TABS
650.0000 mg | ORAL_TABLET | ORAL | Status: DC | PRN
  Administered 2022-04-27 (×2): 650 mg via ORAL
  Filled 2022-04-27 (×2): qty 2

## 2022-04-27 MED ORDER — SENNOSIDES-DOCUSATE SODIUM 8.6-50 MG PO TABS
2.0000 | ORAL_TABLET | Freq: Every day | ORAL | Status: DC
  Administered 2022-04-28: 2 via ORAL
  Filled 2022-04-27: qty 2

## 2022-04-27 MED ORDER — COCONUT OIL OIL: 1.0000 | TOPICAL_OIL | Status: DC | PRN

## 2022-04-27 MED ORDER — WITCH HAZEL-GLYCERIN EX PADS: 1.0000 | MEDICATED_PAD | CUTANEOUS | Status: DC | PRN

## 2022-04-27 MED ORDER — SIMETHICONE 80 MG PO CHEW: 80.0000 mg | CHEWABLE_TABLET | ORAL | Status: DC | PRN

## 2022-04-27 MED ORDER — OXYCODONE HCL 5 MG PO TABS: 10.0000 mg | ORAL_TABLET | ORAL | Status: DC | PRN

## 2022-04-27 MED ORDER — ZOLPIDEM TARTRATE 5 MG PO TABS: 5.0000 mg | ORAL_TABLET | Freq: Every evening | ORAL | Status: DC | PRN

## 2022-04-27 MED ORDER — PRENATAL MULTIVITAMIN CH
1.0000 | ORAL_TABLET | Freq: Every day | ORAL | Status: DC
Start: 2022-04-27 — End: 2022-04-28

## 2022-04-27 MED ORDER — TETANUS-DIPHTH-ACELL PERTUSSIS 5-2.5-18.5 LF-MCG/0.5 IM SUSY: 0.5000 mL | PREFILLED_SYRINGE | Freq: Once | INTRAMUSCULAR | Status: DC

## 2022-04-27 MED ORDER — OXYCODONE HCL 5 MG PO TABS
5.0000 mg | ORAL_TABLET | ORAL | Status: DC | PRN
  Administered 2022-04-27 (×2): 5 mg via ORAL
  Filled 2022-04-27 (×2): qty 1

## 2022-04-27 MED ORDER — DIPHENHYDRAMINE HCL 25 MG PO CAPS
25.0000 mg | ORAL_CAPSULE | Freq: Four times a day (QID) | ORAL | Status: DC | PRN
  Administered 2022-04-28: 25 mg via ORAL
  Filled 2022-04-27: qty 1

## 2022-04-27 MED ORDER — DIBUCAINE (PERIANAL) 1 % EX OINT
1.0000 | TOPICAL_OINTMENT | CUTANEOUS | Status: DC | PRN
Start: 2022-04-27 — End: 2022-04-28

## 2022-04-27 NOTE — Anesthesia Postprocedure Evaluation (Signed)
Anesthesia Post Note  Patient: Mayelin Panos  Procedure(s) Performed: AN AD HOC LABOR EPIDURAL     Patient location during evaluation: Mother Baby Anesthesia Type: Epidural Level of consciousness: awake and alert Pain management: pain level controlled Vital Signs Assessment: post-procedure vital signs reviewed and stable Respiratory status: spontaneous breathing, nonlabored ventilation and respiratory function stable Cardiovascular status: stable Postop Assessment: no headache, no backache and epidural receding Anesthetic complications: no   No notable events documented.  Last Vitals:  Vitals:   04/27/22 1023 04/27/22 1532  BP: 120/76 98/61  Pulse: 95 65  Resp: 16 18  Temp: 36.8 C 36.8 C  SpO2:      Last Pain:  Vitals:   04/27/22 1532  TempSrc: Axillary  PainSc: (P) 2    Pain Goal: Patients Stated Pain Goal: 0 (04/27/22 0136)                 Marrion Coy

## 2022-04-28 LAB — CBC
HCT: 29.7 % — ABNORMAL LOW (ref 36.0–46.0)
Hemoglobin: 9.8 g/dL — ABNORMAL LOW (ref 12.0–15.0)
MCH: 30.4 pg (ref 26.0–34.0)
MCHC: 33 g/dL (ref 30.0–36.0)
MCV: 92.2 fL (ref 80.0–100.0)
Platelets: 193 10*3/uL (ref 150–400)
RBC: 3.22 MIL/uL — ABNORMAL LOW (ref 3.87–5.11)
RDW: 13.8 % (ref 11.5–15.5)
WBC: 9.6 10*3/uL (ref 4.0–10.5)
nRBC: 0 % (ref 0.0–0.2)

## 2022-04-28 MED ORDER — IBUPROFEN 600 MG PO TABS: 600.0000 mg | ORAL_TABLET | Freq: Four times a day (QID) | ORAL | 0 refills | Status: DC

## 2022-04-28 MED ORDER — ACETAMINOPHEN 325 MG PO TABS: 650.0000 mg | ORAL_TABLET | ORAL | 1 refills | Status: DC | PRN

## 2022-04-28 NOTE — Lactation Note (Signed)
This note was copied from a baby's chart. Lactation Consultation Note Per RN mom declines Lactation.  Patient Name: Jordan Mendez OLMBE'M Date: 04/28/2022   Age:35 hours  Maternal Data    Feeding    LATCH Score                    Lactation Tools Discussed/Used    Interventions    Discharge    Consult Status Consult Status: Complete    Daisy Lites G 04/28/2022, 1:53 AM

## 2022-04-28 NOTE — Discharge Summary (Signed)
Postpartum Discharge Summary       Patient Name: Jordan Mendez DOB: [birth year] MRN: [id number]  Date of admission: 04/26/2022 Delivery date:04/27/2022  Delivering provider: Philip Aspen  Date of discharge: 04/28/2022  Admitting diagnosis: Pregnant and not yet delivered [Z34.90] Intrauterine pregnancy: [redacted]w[redacted]d     Secondary diagnosis:  Principal Problem:   Pregnant and not yet delivered  Additional problems: none    Discharge diagnosis: Term Pregnancy Delivered                                              Post partum procedures: none Augmentation: AROM and Pitocin Complications: None  Hospital course: Induction of Labor With Vaginal Delivery   36 y.o. yo G3P3003 at [redacted]w[redacted]d was admitted to the hospital 04/26/2022 for induction of labor.  Indication for induction: Elective.  Patient had an uncomplicated labor course as follows: Membrane Rupture Time/Date: 10:12 PM ,04/26/2022   Delivery Method:Vaginal, Spontaneous  Episiotomy: None  Lacerations:  None  Details of delivery can be found in separate delivery note.  Patient had a routine postpartum course. Patient is discharged home 04/28/22.  Newborn Data: Birth date:04/27/2022  Birth time:6:01 AM  Gender:Female  Living status:Living  Apgars:7 ,9  Weight:3430 g     Physical exam  Vitals:   04/27/22 1023 04/27/22 1532 04/27/22 2023 04/28/22 0652  BP: 120/76 98/61 108/73 106/69  Pulse: 95 65 67 62  Resp: 16 18 18 18   Temp: 98.3 F (36.8 C) 98.3 F (36.8 C) 98.2 F (36.8 C)   TempSrc: Axillary Axillary    SpO2:   97% 94%  Weight:      Height:       General: alert and cooperative Lochia: appropriate Uterine Fundus: firm  Labs: Lab Results  Component Value Date   WBC 9.6 04/28/2022   HGB 9.8 (L) 04/28/2022   HCT 29.7 (L) 04/28/2022   MCV 92.2 04/28/2022   PLT 193 04/28/2022       No data to display         Edinburgh Score:    04/28/2022    6:52 AM  Edinburgh Postnatal Depression Scale Screening Tool   I have been able to laugh and see the funny side of things. 3  I have looked forward with enjoyment to things. 0  I have blamed myself unnecessarily when things went wrong. 0  I have been anxious or worried for no good reason. 0  I have felt scared or panicky for no good reason. 0  Things have been getting on top of me. 0  I have been so unhappy that I have had difficulty sleeping. 0  I have felt sad or miserable. 0  I have been so unhappy that I have been crying. 0  The thought of harming myself has occurred to me. 0  Edinburgh Postnatal Depression Scale Total 3     After visit meds:  Allergies as of 04/28/2022   No Known Allergies      Medication List     TAKE these medications    acetaminophen 325 MG tablet Commonly known as: Tylenol Take 2 tablets (650 mg total) by mouth every 4 (four) hours as needed (for pain scale < 4).   ibuprofen 600 MG tablet Commonly known as: ADVIL Take 1 tablet (600 mg total) by mouth every 6 (six) hours.   PRENATAL VITAMIN  PO Prenatal Vitamin         Discharge home in stable condition Infant Feeding: Breast Infant Disposition:home with mother Discharge instruction: per After Visit Summary and Postpartum booklet. Activity: Advance as tolerated. Pelvic rest for 6 weeks.  Diet: routine diet Future Appointments:No future appointments. Follow up Visit:  Follow-up Information     Philip Aspen, DO. Schedule an appointment as soon as possible for a visit in 4 week(s).   Specialty: Obstetrics and Gynecology Why: routine postpartum Contact information: [address] [place] [address] [phone]                  Please schedule this patient for a In person postpartum visit in 4 weeks with the following provider: MD.  Delivery mode:  Vaginal, Spontaneous  Anticipated Birth Control:  IUD   04/28/2022 Oliver Pila, MD

## 2022-04-28 NOTE — Progress Notes (Signed)
Called pt to check on her meals by Orlan Leavens Spanish Medical Interpreter.

## 2022-04-28 NOTE — Progress Notes (Signed)
Post Partum Day 1 Subjective: no complaints, up ad lib, and voiding  Feeling good and wanting d/c home today  Objective: Blood pressure 106/69, pulse 62, temperature 98.2 F (36.8 C), resp. rate 18, height 5\' 2"  (1.575 m), weight 64.5 kg, SpO2 94 %, unknown if currently breastfeeding.  Physical Exam:  General: alert and cooperative Lochia: appropriate Uterine Fundus: firm  Recent Labs    04/26/22 2018 04/28/22 0545  HGB 11.2* 9.8*  HCT 32.3* 29.7*    Assessment/Plan: Discharge home   LOS: 2 days   04/30/22, MD 04/28/2022, 10:10 AM

## 2022-05-01 ENCOUNTER — Inpatient Hospital Stay (HOSPITAL_COMMUNITY)
Admission: AD | Admit: 2022-05-01 | Discharge: 2022-05-06 | DRG: 769 | Disposition: A | Discharge status: Home / Self Care | Payer: Commercial Managed Care - PPO | Attending: Obstetrics and Gynecology | Admitting: Obstetrics and Gynecology

## 2022-05-01 ENCOUNTER — Inpatient Hospital Stay (HOSPITAL_COMMUNITY): Payer: Commercial Managed Care - PPO

## 2022-05-01 ENCOUNTER — Encounter (HOSPITAL_COMMUNITY): Payer: Self-pay | Admitting: Obstetrics and Gynecology

## 2022-05-01 DIAGNOSIS — K810 Acute cholecystitis: Secondary | ICD-10-CM | POA: Diagnosis present

## 2022-05-01 DIAGNOSIS — B9620 Unspecified Escherichia coli [E. coli] as the cause of diseases classified elsewhere: Secondary | ICD-10-CM | POA: Diagnosis present

## 2022-05-01 DIAGNOSIS — O8622 Infection of bladder following delivery: Secondary | ICD-10-CM | POA: Diagnosis not present

## 2022-05-01 DIAGNOSIS — O8621 Infection of kidney following delivery: Principal | ICD-10-CM | POA: Diagnosis present

## 2022-05-01 DIAGNOSIS — K429 Umbilical hernia without obstruction or gangrene: Secondary | ICD-10-CM | POA: Diagnosis present

## 2022-05-01 DIAGNOSIS — O864 Pyrexia of unknown origin following delivery: Secondary | ICD-10-CM | POA: Diagnosis present

## 2022-05-01 DIAGNOSIS — R7401 Elevation of levels of liver transaminase levels: Secondary | ICD-10-CM | POA: Diagnosis present

## 2022-05-01 DIAGNOSIS — K819 Cholecystitis, unspecified: Secondary | ICD-10-CM | POA: Diagnosis not present

## 2022-05-01 DIAGNOSIS — O9963 Diseases of the digestive system complicating the puerperium: Secondary | ICD-10-CM | POA: Diagnosis present

## 2022-05-01 DIAGNOSIS — N12 Tubulo-interstitial nephritis, not specified as acute or chronic: Principal | ICD-10-CM

## 2022-05-01 LAB — URINALYSIS, ROUTINE W REFLEX MICROSCOPIC
Bilirubin Urine: NEGATIVE
Glucose, UA: NEGATIVE mg/dL
Ketones, ur: NEGATIVE mg/dL
Nitrite: POSITIVE — AB
Protein, ur: NEGATIVE mg/dL
Specific Gravity, Urine: 1.01 (ref 1.005–1.030)
pH: 5 (ref 5.0–8.0)

## 2022-05-01 LAB — BASIC METABOLIC PANEL
Anion gap: 11 (ref 5–15)
BUN: 14 mg/dL (ref 6–20)
CO2: 20 mmol/L — ABNORMAL LOW (ref 22–32)
Calcium: 8.4 mg/dL — ABNORMAL LOW (ref 8.9–10.3)
Chloride: 104 mmol/L (ref 98–111)
Creatinine, Ser: 0.71 mg/dL (ref 0.44–1.00)
GFR, Estimated: >60 mL/min (ref >60)
Glucose, Bld: 107 mg/dL — ABNORMAL HIGH (ref 70–99)
Potassium: 3.3 mmol/L — ABNORMAL LOW (ref 3.5–5.1)
Sodium: 135 mmol/L (ref 135–145)

## 2022-05-01 LAB — CBC WITH DIFFERENTIAL/PLATELET
Abs Immature Granulocytes: 0.06 10*3/uL (ref 0.00–0.07)
Basophils Absolute: 0 10*3/uL (ref 0.0–0.1)
Basophils Relative: 0 %
Eosinophils Absolute: 0 10*3/uL (ref 0.0–0.5)
Eosinophils Relative: 0 %
HCT: 36.5 % (ref 36.0–46.0)
Hemoglobin: 11.9 g/dL — ABNORMAL LOW (ref 12.0–15.0)
Immature Granulocytes: 1 %
Lymphocytes Relative: 4 %
Lymphs Abs: 0.4 10*3/uL — ABNORMAL LOW (ref 0.7–4.0)
MCH: 29.9 pg (ref 26.0–34.0)
MCHC: 32.6 g/dL (ref 30.0–36.0)
MCV: 91.7 fL (ref 80.0–100.0)
Monocytes Absolute: 0.3 10*3/uL (ref 0.1–1.0)
Monocytes Relative: 2 %
Neutro Abs: 9.8 10*3/uL — ABNORMAL HIGH (ref 1.7–7.7)
Neutrophils Relative %: 93 %
Platelets: 274 10*3/uL (ref 150–400)
RBC: 3.98 MIL/uL (ref 3.87–5.11)
RDW: 13.3 % (ref 11.5–15.5)
WBC: 10.5 10*3/uL (ref 4.0–10.5)
nRBC: 0 % (ref 0.0–0.2)

## 2022-05-01 MED ORDER — DOCUSATE SODIUM 100 MG PO CAPS
100.0000 mg | ORAL_CAPSULE | Freq: Two times a day (BID) | ORAL | Status: DC
  Administered 2022-05-02 – 2022-05-05 (×7): 100 mg via ORAL
  Filled 2022-05-01 (×7): qty 1

## 2022-05-01 MED ORDER — OXYCODONE HCL 5 MG PO TABS
5.0000 mg | ORAL_TABLET | ORAL | Status: DC | PRN
  Administered 2022-05-04 – 2022-05-05 (×2): 5 mg via ORAL
  Filled 2022-05-01 (×2): qty 1

## 2022-05-01 MED ORDER — COCONUT OIL OIL: 1.0000 | TOPICAL_OIL | Status: DC | PRN

## 2022-05-01 MED ORDER — SODIUM CHLORIDE 0.9 % IV SOLN
1.0000 g | INTRAVENOUS | Status: DC
  Administered 2022-05-01: 1 g via INTRAVENOUS
  Filled 2022-05-01: qty 10

## 2022-05-01 MED ORDER — LACTATED RINGERS IV SOLN
INTRAVENOUS | Status: DC
Start: 2022-05-01 — End: 2022-05-01

## 2022-05-01 MED ORDER — OXYCODONE-ACETAMINOPHEN 5-325 MG PO TABS
2.0000 | ORAL_TABLET | Freq: Once | ORAL | Status: AC
  Administered 2022-05-01: 2 via ORAL
  Filled 2022-05-01: qty 2

## 2022-05-01 MED ORDER — OXYCODONE HCL 5 MG PO TABS: 10.0000 mg | ORAL_TABLET | ORAL | Status: DC | PRN

## 2022-05-01 MED ORDER — SODIUM CHLORIDE 0.9 % IV SOLN
INTRAVENOUS | Status: DC
  Administered 2022-05-03: 125 mL/h via INTRAVENOUS

## 2022-05-01 MED ORDER — PRENATAL MULTIVITAMIN CH
1.0000 | ORAL_TABLET | Freq: Every day | ORAL | Status: DC
  Administered 2022-05-02 – 2022-05-05 (×3): 1 via ORAL
  Filled 2022-05-01 (×3): qty 1

## 2022-05-01 MED ORDER — SIMETHICONE 80 MG PO CHEW
80.0000 mg | CHEWABLE_TABLET | ORAL | Status: DC | PRN
  Administered 2022-05-02: 80 mg via ORAL
  Filled 2022-05-01: qty 1

## 2022-05-01 MED ORDER — POLYETHYLENE GLYCOL 3350 17 G PO PACK
17.0000 g | PACK | Freq: Every day | ORAL | Status: DC
  Administered 2022-05-01 – 2022-05-05 (×4): 17 g via ORAL
  Filled 2022-05-01 (×4): qty 1

## 2022-05-01 MED ORDER — IBUPROFEN 600 MG PO TABS
600.0000 mg | ORAL_TABLET | Freq: Four times a day (QID) | ORAL | Status: DC
  Administered 2022-05-01 – 2022-05-06 (×15): 600 mg via ORAL
  Filled 2022-05-01 (×18): qty 1

## 2022-05-01 MED ORDER — LACTATED RINGERS IV SOLN: INTRAVENOUS | Status: DC

## 2022-05-01 MED ORDER — ONDANSETRON HCL 4 MG/2ML IJ SOLN: 4.0000 mg | INTRAMUSCULAR | Status: DC | PRN

## 2022-05-01 MED ORDER — ONDANSETRON HCL 4 MG PO TABS: 4.0000 mg | ORAL_TABLET | ORAL | Status: DC | PRN

## 2022-05-01 MED ORDER — ACETAMINOPHEN 325 MG PO TABS
650.0000 mg | ORAL_TABLET | ORAL | Status: DC | PRN
  Administered 2022-05-03: 650 mg via ORAL
  Filled 2022-05-01: qty 2

## 2022-05-01 NOTE — MAU Provider Note (Addendum)
History     CSN: [id number]  Arrival date and time: 05/01/22 1456   Event Date/Time   First Provider Initiated Contact with Patient 05/01/22 1530   **In-house Spanish Interpreter offered - patient declined reporting she could speak and understand English with no problems.**    Chief Complaint  Patient presents with   Back Pain   Chills   Constipation   [name] is a 36 y.o. year old G73P3003 female who is 4 days PP s/p SVD who presents to MAU reporting back pain (middle of back - "more to the LT side"), chills since 1400 and no BM since 04/28/22 despite eating normally. She is breastfeeding without any difficulties. She reports she thinks her milk came in yesterday. She reports that she had difficulties urinating after she delivered her baby and she was catheterized. She states when they put the catheter in, she "filled up the bag immediately." She wants to know if she could have a kidney because of that. She is also concerned about no BM in 3 days. She receives [facility] with [facility]. Her spouse is present and contributing to the history taking.     OB History     Gravida  3   Para  3   Term  3   Preterm      AB      Living  3      SAB      IAB      Ectopic      Multiple  0   Live Births  3           Past Medical History:  Diagnosis Date   Medical history non-contributory     Past Surgical History:  Procedure Laterality Date   BARTHOLIN CYST MARSUPIALIZATION     CLUB FOOT RELEASE     TONSILLECTOMY AND ADENOIDECTOMY      Family History  Problem Relation Age of Onset   Kidney disease Maternal Grandmother    Diabetes Maternal Grandmother     Social History   Tobacco Use   Smoking status: Never   Smokeless tobacco: Never  Vaping Use   Vaping Use: Never used  Substance Use Topics   Alcohol use: Not Currently   Drug use: Never    Allergies: No Known Allergies  Medications Prior to Admission  Medication Sig Dispense Refill Last Dose    ibuprofen (ADVIL) 600 MG tablet Take 1 tablet (600 mg total) by mouth every 6 (six) hours. 30 tablet 0 05/01/2022 at 0900   acetaminophen (TYLENOL) 325 MG tablet Take 2 tablets (650 mg total) by mouth every 4 (four) hours as needed (for pain scale < 4). 30 tablet 1    Prenatal Vit-Fe Fumarate-FA (PRENATAL VITAMIN PO) Prenatal Vitamin       Review of Systems  Constitutional: Negative.   HENT: Negative.    Eyes: Negative.   Respiratory: Negative.    Cardiovascular: Negative.   Gastrointestinal: Negative.   Endocrine: Negative.   Genitourinary: Negative.   Musculoskeletal:  Positive for back pain (middle of back, but more to the LT).  Skin: Negative.   Allergic/Immunologic: Negative.   Neurological: Negative.   Hematological: Negative.   Psychiatric/Behavioral: Negative.     Physical Exam   Patient Vitals for the past 24 hrs:  BP Temp Temp src Pulse Resp SpO2  05/01/22 1724 -- 100.2 F (37.9 C) Oral -- -- --  05/01/22 1606 (!) 126/51 -- -- 88 -- --  05/01/22 1511 (!) 148/83 Marland Kitchen)  102.8 F (39.3 C) Oral (!) 107 20 99 %     Physical Exam Vitals and nursing note reviewed.  Constitutional:      Appearance: She is normal weight. She is ill-appearing (shivering).  Cardiovascular:     Rate and Rhythm: Tachycardia present.  Pulmonary:     Effort: Pulmonary effort is normal.  Abdominal:     Palpations: Abdomen is soft.     Tenderness: There is left CVA tenderness (significant).  Genitourinary:    Comments: Breasts engorged, but no s/s of mastitis apparent with examination Musculoskeletal:        General: Normal range of motion.  Skin:    General: Skin is warm.     Comments: Hot to the touch  Neurological:     Mental Status: She is alert and oriented to person, place, and time.  Psychiatric:        Mood and Affect: Mood normal.        Behavior: Behavior normal.        Thought Content: Thought content normal.        Judgment: Judgment normal.    Reassessment @ 1724:  Patient's pain is improved, but now reports a "mild H/A" since being in U/S. Pain rated 3/10. Reports nursing baby without difficulty.  MAU Course  Procedures  MDM CCUA - not resulted d/t lost specimen -- specimen recollected  UCx -- Results pending  Percocet 5/325 mg x 2 tablets po -- improved pain Renal U/S Insert Peripheral IV LR infusion 1000 ml @ 125 ml/hr CBC w/Diff BMP   *Consult with Dr. Vergie Living @ 1750 - notified of patient's complaints, assessments, lab & U/S results, recommended tx plan admit for IV abx, orders received for CBC and BMP  *Consult with Dr. Timothy Lasso @ 1752 - notified of patient's complaints, assessments, lab, U/S results, and Dr. Vergie Living' recommended tx plan - agrees with plan to admit for IV abx -- will enter admission orders.  No results found for this or any previous visit (from the past 24 hour(s)).   US RENAL  Result Date: 05/01/2022 CLINICAL DATA:  91320; LEFT flank pain EXAM: RENAL / URINARY TRACT ULTRASOUND COMPLETE COMPARISON:  None Available. FINDINGS: Right Kidney: Renal measurements: 9.8 x 5.0 x 6.2 cm = volume: 158 mL. Echogenicity within normal limits. No mass or hydronephrosis visualized. Left Kidney: Renal measurements: 11.8 x 5.1 x 4.9 cm = volume: 155 mL. Echogenicity within normal limits. No mass or hydronephrosis visualized. Minimal prominence of the renal pelvis which resolves postvoid. Bladder: Mobile debris is noted within the bladder. Other: None. IMPRESSION: 1. There is mobile debris noted within the bladder. Findings could reflect cystitis in the appropriate clinical setting. Recommend correlation with urine analysis. 2. No hydronephrosis. Please note that ultrasound is insensitive for detection of pyelonephritis. Recommend correlation with physical exam findings. Electronically Signed   By: Meda Klinefelter M.D.   On: 05/01/2022 17:22    Assessment and Plan  1. Pyelonephritis 2. Postpartum acute cystitis  - Admit to [facility] - Admission  orders by Dr. Timothy Lasso - See Dr. Casper Harrison H&P documentation  Raelyn Mora, CNM 05/01/2022, 3:43 PM

## 2022-05-01 NOTE — MAU Note (Addendum)
Jordan Mendez is a 36 y.o. here in MAU reporting: back pain that started yesterday. Pt states the pain is in the middle of her back and more on the left side. She also reports chills that started at 1400 today. Pt reports she has not had a BM since 04/28/2022.  Onset of complaint: 04/30/2022 Pain score: 5/10 Vitals:   05/01/22 1511  BP: (!) 148/83  Pulse: (!) 107  Resp: 20  Temp: (!) 102.8 F (39.3 C)  SpO2: 99%      Lab orders placed from triage:

## 2022-05-01 NOTE — H&P (Signed)
Jordan Mendez is a 36 y.o. female [phone] PPD#4 s/p SVD with left pyelonephritis  Presented to MAU today complaining of left back pain and chills. In MAU, T 102.44F and significant left CVA tenderness noted. Renal US suspicious for cystitis but otherwise negative.  No shortness of breath, abdominal pain, breast pain, leg pain. Does report constipation (has not had BM x 4 days).  She is breastfeeding  Had SVD on 6/28 complicated by shoulder dystocia, but otherwise uncomplicated pregnancy, labor and postpartum course.   OB History     Gravida  3   Para  3   Term  3   Preterm      AB      Living  3      SAB      IAB      Ectopic      Multiple  0   Live Births  3          Past Medical History:  Diagnosis Date   Medical history non-contributory    Past Surgical History:  Procedure Laterality Date   BARTHOLIN CYST MARSUPIALIZATION     CLUB FOOT RELEASE     TONSILLECTOMY AND ADENOIDECTOMY     Family History: family history includes Diabetes in her maternal grandmother; Kidney disease in her maternal grandmother. Social History:  reports that she has never smoked. She has never used smokeless tobacco. She reports that she does not currently use alcohol. She reports that she does not use drugs.       Review of Systems Per HPI Exam Physical Exam    Blood pressure (!) 126/51, pulse 88, temperature 100.2 F (37.9 C), temperature source Oral, resp. rate 20, SpO2 99 %, currently breastfeeding.  Gen: tearful, appears ill  CVS: normal pulses Lungs; Nonlabored respirations Abd: soft, uterine fundus below umbilicus and nontender Back: marked  left CVA tenderness  CBC    Component Value Date/Time   WBC 10.5 05/01/2022 1814   RBC 3.98 05/01/2022 1814   HGB 11.9 (L) 05/01/2022 1814   HCT 36.5 05/01/2022 1814   PLT 274 05/01/2022 1814   MCV 91.7 05/01/2022 1814   MCH 29.9 05/01/2022 1814   MCHC 32.6 05/01/2022 1814   RDW 13.3 05/01/2022 1814   LYMPHSABS 0.4 (L)  05/01/2022 1814   MONOABS 0.3 05/01/2022 1814   EOSABS 0.0 05/01/2022 1814   BASOSABS 0.0 05/01/2022 1814   CMP     Component Value Date/Time   NA 135 05/01/2022 1814   K 3.3 (L) 05/01/2022 1814   CL 104 05/01/2022 1814   CO2 20 (L) 05/01/2022 1814   GLUCOSE 107 (H) 05/01/2022 1814   BUN 14 05/01/2022 1814   CREATININE 0.71 05/01/2022 1814   CALCIUM 8.4 (L) 05/01/2022 1814   GFRNONAA >60 05/01/2022 1814   Urinalysis    Component Value Date/Time   COLORURINE YELLOW 05/01/2022 1613   APPEARANCEUR HAZY (A) 05/01/2022 1613   LABSPEC 1.010 05/01/2022 1613   PHURINE 5.0 05/01/2022 1613   GLUCOSEU NEGATIVE 05/01/2022 1613   HGBUR MODERATE (A) 05/01/2022 1613   BILIRUBINUR NEGATIVE 05/01/2022 1613   KETONESUR NEGATIVE 05/01/2022 1613   PROTEINUR NEGATIVE 05/01/2022 1613   NITRITE POSITIVE (A) 05/01/2022 1613   LEUKOCYTESUR SMALL (A) 05/01/2022 1613     US RENAL   Result Date: 05/01/2022 CLINICAL DATA:  91320; LEFT flank pain EXAM: RENAL / URINARY TRACT ULTRASOUND COMPLETE COMPARISON:  None Available. FINDINGS: Right Kidney: Renal measurements: 9.8 x 5.0 x 6.2 cm = volume:  158 mL. Echogenicity within normal limits. No mass or hydronephrosis visualized. Left Kidney: Renal measurements: 11.8 x 5.1 x 4.9 cm = volume: 155 mL. Echogenicity within normal limits. No mass or hydronephrosis visualized. Minimal prominence of the renal pelvis which resolves postvoid. Bladder: Mobile debris is noted within the bladder. Other: None. IMPRESSION: 1. There is mobile debris noted within the bladder. Findings could reflect cystitis in the appropriate clinical setting. Recommend correlation with urine analysis. 2. No hydronephrosis. Please note that ultrasound is insensitive for detection of pyelonephritis. Recommend correlation with physical exam findings. Electronically Signed   By: Meda Klinefelter M.D.   On: 05/01/2022 17:22     Assessment/Plan: 36Y P3 PPD#4 s/p SVD with left pyelonephritis, admit  for IV antibiotics - No signs of endometritis, mastitis, atelectasis, DVT - UA + leuks, nitrites, blood; urine culture pending - Blood cultures ordered - IV rocephin 1g q 24hrs   - LR @ 125 - Tylenol, oxycodone PRN pain - Colace and miralax for bowel management   Charlett Nose 05/01/2022, 7:15 PM

## 2022-05-02 ENCOUNTER — Other Ambulatory Visit: Payer: Self-pay

## 2022-05-02 LAB — BLOOD CULTURE ID PANEL (REFLEXED) - BCID2

## 2022-05-02 MED ORDER — SODIUM CHLORIDE 0.9 % IV SOLN
2.0000 g | INTRAVENOUS | Status: DC
Start: 2022-05-02 — End: 2022-05-03
  Administered 2022-05-02: 2 g via INTRAVENOUS
  Filled 2022-05-02: qty 20

## 2022-05-02 NOTE — Progress Notes (Signed)
36 y.o. [id number] PPD#5 HD#1 admitted for Fever of unknown origin during puerperium [O86.4].  Admitted with fever, left flank pain--suspected pyeloneprhitis.  Reports feeling much better since admission.  No flank pain at rest, now only with movement.  Last temperature 102.74F on admission.  Vital signs have otherwise been normal Urine culture prelim gram negative rods Contacted by pharmacy with positive blood culture for e.coli and enterobacterales.  OK to continue rocephin while sensitivities pending, but dose increased to 2gm q24h  Vitals:   05/02/22 0403 05/02/22 0800 05/02/22 1200 05/02/22 1311  BP: (!) 100/55 (!) 111/52 112/69 116/66  Pulse: 62 (!) 58 64 68  Resp: 16 16 16 19   Temp: 97.7 F (36.5 C) 98.2 F (36.8 C) 98.3 F (36.8 C) 98.3 F (36.8 C)  TempSrc: Oral Oral Oral Oral  SpO2: 99% 99% 96% 99%    NAD Trace lower extremity edema Exam otherwise deferred--skin to skin with baby   Results for orders placed or performed during the hospital encounter of 05/01/22 (from the past 24 hour(s))  Culture, OB Urine     Status: Abnormal (Preliminary result)   Collection Time: 05/01/22  3:42 PM   Specimen: OB Clean Catch; Urine  Result Value Ref Range   Specimen Description OB CLEAN CATCH    Special Requests Immunocompromised    Culture (A)     >=100,000 COLONIES/mL GRAM NEGATIVE RODS SUSCEPTIBILITIES TO FOLLOW NO GROUP B STREP (S.AGALACTIAE) ISOLATED Performed at [facility], [address], [place], [place]    Report Status PENDING   Urinalysis, Routine w reflex microscopic Urine, Clean Catch     Status: Abnormal   Collection Time: 05/01/22  4:13 PM  Result Value Ref Range   Color, Urine YELLOW YELLOW   APPearance HAZY (A) CLEAR   Specific Gravity, Urine 1.010 1.005 - 1.030   pH 5.0 5.0 - 8.0   Glucose, UA NEGATIVE NEGATIVE mg/dL   Hgb urine dipstick MODERATE (A) NEGATIVE   Bilirubin Urine NEGATIVE NEGATIVE   Ketones, ur NEGATIVE NEGATIVE mg/dL    Protein, ur NEGATIVE NEGATIVE mg/dL   Nitrite POSITIVE (A) NEGATIVE   Leukocytes,Ua SMALL (A) NEGATIVE   RBC / HPF 0-5 0 - 5 RBC/hpf   WBC, UA 21-50 0 - 5 WBC/hpf   Bacteria, UA RARE (A) NONE SEEN   Squamous Epithelial / LPF 0-5 0 - 5   Mucus PRESENT   CBC with Differential/Platelet     Status: Abnormal   Collection Time: 05/01/22  6:14 PM  Result Value Ref Range   WBC 10.5 4.0 - 10.5 K/uL   RBC 3.98 3.87 - 5.11 MIL/uL   Hemoglobin 11.9 (L) 12.0 - 15.0 g/dL   HCT 07/02/22 34.1 - 96.2 %   MCV 91.7 80.0 - 100.0 fL   MCH 29.9 26.0 - 34.0 pg   MCHC 32.6 30.0 - 36.0 g/dL   RDW 22.9 79.8 - 92.1 %   Platelets 274 150 - 400 K/uL   nRBC 0.0 0.0 - 0.2 %   Neutrophils Relative % 93 %   Neutro Abs 9.8 (H) 1.7 - 7.7 K/uL   Lymphocytes Relative 4 %   Lymphs Abs 0.4 (L) 0.7 - 4.0 K/uL   Monocytes Relative 2 %   Monocytes Absolute 0.3 0.1 - 1.0 K/uL   Eosinophils Relative 0 %   Eosinophils Absolute 0.0 0.0 - 0.5 K/uL   Basophils Relative 0 %   Basophils Absolute 0.0 0.0 - 0.1 K/uL   Immature Granulocytes  1 %   Abs Immature Granulocytes 0.06 0.00 - 0.07 K/uL  Basic metabolic panel     Status: Abnormal   Collection Time: 05/01/22  6:14 PM  Result Value Ref Range   Sodium 135 135 - 145 mmol/L   Potassium 3.3 (L) 3.5 - 5.1 mmol/L   Chloride 104 98 - 111 mmol/L   CO2 20 (L) 22 - 32 mmol/L   Glucose, Bld 107 (H) 70 - 99 mg/dL   BUN 14 6 - 20 mg/dL   Creatinine, Ser 5.78 0.44 - 1.00 mg/dL   Calcium 8.4 (L) 8.9 - 10.3 mg/dL   GFR, Estimated >46 >96 mL/min   Anion gap 11 5 - 15  Culture, blood (Routine X 2) w Reflex to ID Panel     Status: None (Preliminary result)   Collection Time: 05/01/22  6:14 PM   Specimen: BLOOD RIGHT ARM  Result Value Ref Range   Specimen Description BLOOD RIGHT ARM    Special Requests      BOTTLES DRAWN AEROBIC AND ANAEROBIC Blood Culture adequate volume   Culture  Setup Time      GRAM NEGATIVE RODS IN BOTH AEROBIC AND ANAEROBIC BOTTLES CRITICAL RESULT CALLED TO,  READ BACK BY AND VERIFIED WITH: PHARMD DELORES Cassie Freer [phone] [id number] BY EC Performed at [facility], [address], [place], [place] [address]    Culture GRAM NEGATIVE RODS    Report Status PENDING   Blood Culture ID Panel (Reflexed)     Status: Abnormal   Collection Time: 05/01/22  6:14 PM  Result Value Ref Range   Enterococcus faecalis NOT DETECTED NOT DETECTED   Enterococcus Faecium NOT DETECTED NOT DETECTED   Listeria monocytogenes NOT DETECTED NOT DETECTED   Staphylococcus species NOT DETECTED NOT DETECTED   Staphylococcus aureus (BCID) NOT DETECTED NOT DETECTED   Staphylococcus epidermidis NOT DETECTED NOT DETECTED   Staphylococcus lugdunensis NOT DETECTED NOT DETECTED   Streptococcus species NOT DETECTED NOT DETECTED   Streptococcus agalactiae NOT DETECTED NOT DETECTED   Streptococcus pneumoniae NOT DETECTED NOT DETECTED   Streptococcus pyogenes NOT DETECTED NOT DETECTED   A.calcoaceticus-baumannii NOT DETECTED NOT DETECTED   Bacteroides fragilis NOT DETECTED NOT DETECTED   Enterobacterales DETECTED (A) NOT DETECTED   Enterobacter cloacae complex NOT DETECTED NOT DETECTED   Escherichia coli DETECTED (A) NOT DETECTED   Klebsiella aerogenes NOT DETECTED NOT DETECTED   Klebsiella oxytoca NOT DETECTED NOT DETECTED   Klebsiella pneumoniae NOT DETECTED NOT DETECTED   Proteus species NOT DETECTED NOT DETECTED   Salmonella species NOT DETECTED NOT DETECTED   Serratia marcescens NOT DETECTED NOT DETECTED   Haemophilus influenzae NOT DETECTED NOT DETECTED   Neisseria meningitidis NOT DETECTED NOT DETECTED   Pseudomonas aeruginosa NOT DETECTED NOT DETECTED   Stenotrophomonas maltophilia NOT DETECTED NOT DETECTED   Candida albicans NOT DETECTED NOT DETECTED   Candida auris NOT DETECTED NOT DETECTED   Candida glabrata NOT DETECTED NOT DETECTED   Candida krusei NOT DETECTED NOT DETECTED   Candida parapsilosis NOT DETECTED NOT DETECTED   Candida tropicalis NOT DETECTED NOT  DETECTED   Cryptococcus neoformans/gattii NOT DETECTED NOT DETECTED   CTX-M ESBL NOT DETECTED NOT DETECTED   Carbapenem resistance IMP NOT DETECTED NOT DETECTED   Carbapenem resistance KPC NOT DETECTED NOT DETECTED   Carbapenem resistance NDM NOT DETECTED NOT DETECTED   Carbapenem resist OXA 48 LIKE NOT DETECTED NOT DETECTED   Carbapenem resistance VIM NOT DETECTED NOT DETECTED    A:  HD#1  PPD#5 with suspected pyelonephritis and bacteremia Continue rocephin while awaiting sensitivities Clinically improving   Shakiera Edelson GEFFEL Amaury Kuzel

## 2022-05-02 NOTE — Progress Notes (Signed)
PHARMACY - PHYSICIAN COMMUNICATION CRITICAL VALUE ALERT - BLOOD CULTURE IDENTIFICATION (BCID)  Jordan Mendez is an 36 y.o. female who presented to [facility] on 05/01/2022 with a presumed pyelonephritis.  Assessment:  Left pyelonephritis   Name of physician (or Provider) Contacted: Derl Barrow and Marlow Baars  Current antibiotics: Rocephin 1 g IV Q 24  Changes to prescribed antibiotics recommended: Rocephin 2 g IV Q24 while waiting for sensitivities   Results for orders placed or performed during the hospital encounter of 05/01/22  Blood Culture ID Panel (Reflexed) (Collected: 05/01/2022  6:14 PM)  Result Value Ref Range   Enterococcus faecalis NOT DETECTED NOT DETECTED   Enterococcus Faecium NOT DETECTED NOT DETECTED   Listeria monocytogenes NOT DETECTED NOT DETECTED   Staphylococcus species NOT DETECTED NOT DETECTED   Staphylococcus aureus (BCID) NOT DETECTED NOT DETECTED   Staphylococcus epidermidis NOT DETECTED NOT DETECTED   Staphylococcus lugdunensis NOT DETECTED NOT DETECTED   Streptococcus species NOT DETECTED NOT DETECTED   Streptococcus agalactiae NOT DETECTED NOT DETECTED   Streptococcus pneumoniae NOT DETECTED NOT DETECTED   Streptococcus pyogenes NOT DETECTED NOT DETECTED   A.calcoaceticus-baumannii NOT DETECTED NOT DETECTED   Bacteroides fragilis NOT DETECTED NOT DETECTED   Enterobacterales DETECTED (A) NOT DETECTED   Enterobacter cloacae complex NOT DETECTED NOT DETECTED   Escherichia coli DETECTED (A) NOT DETECTED   Klebsiella aerogenes NOT DETECTED NOT DETECTED   Klebsiella oxytoca NOT DETECTED NOT DETECTED   Klebsiella pneumoniae NOT DETECTED NOT DETECTED   Proteus species NOT DETECTED NOT DETECTED   Salmonella species NOT DETECTED NOT DETECTED   Serratia marcescens NOT DETECTED NOT DETECTED   Haemophilus influenzae NOT DETECTED NOT DETECTED   Neisseria meningitidis NOT DETECTED NOT DETECTED   Pseudomonas aeruginosa NOT DETECTED NOT DETECTED    Stenotrophomonas maltophilia NOT DETECTED NOT DETECTED   Candida albicans NOT DETECTED NOT DETECTED   Candida auris NOT DETECTED NOT DETECTED   Candida glabrata NOT DETECTED NOT DETECTED   Candida krusei NOT DETECTED NOT DETECTED   Candida parapsilosis NOT DETECTED NOT DETECTED   Candida tropicalis NOT DETECTED NOT DETECTED   Cryptococcus neoformans/gattii NOT DETECTED NOT DETECTED   CTX-M ESBL NOT DETECTED NOT DETECTED   Carbapenem resistance IMP NOT DETECTED NOT DETECTED   Carbapenem resistance KPC NOT DETECTED NOT DETECTED   Carbapenem resistance NDM NOT DETECTED NOT DETECTED   Carbapenem resist OXA 48 LIKE NOT DETECTED NOT DETECTED   Carbapenem resistance VIM NOT DETECTED NOT DETECTED    Mendez, Jordan Coupland 05/02/2022  10:15 AM

## 2022-05-03 ENCOUNTER — Inpatient Hospital Stay (HOSPITAL_COMMUNITY): Payer: Commercial Managed Care - PPO

## 2022-05-03 LAB — COMPREHENSIVE METABOLIC PANEL
ALT: 331 U/L — ABNORMAL HIGH (ref 0–44)
AST: 197 U/L — ABNORMAL HIGH (ref 15–41)
Albumin: 2.1 g/dL — ABNORMAL LOW (ref 3.5–5.0)
Alkaline Phosphatase: 146 U/L — ABNORMAL HIGH (ref 38–126)
Anion gap: 7 (ref 5–15)
BUN: 11 mg/dL (ref 6–20)
CO2: 21 mmol/L — ABNORMAL LOW (ref 22–32)
Calcium: 7.5 mg/dL — ABNORMAL LOW (ref 8.9–10.3)
Chloride: 110 mmol/L (ref 98–111)
Creatinine, Ser: 0.56 mg/dL (ref 0.44–1.00)
GFR, Estimated: >60 mL/min (ref >60)
Glucose, Bld: 76 mg/dL (ref 70–99)
Potassium: 4.1 mmol/L (ref 3.5–5.1)
Sodium: 138 mmol/L (ref 135–145)
Total Bilirubin: 0.5 mg/dL (ref 0.3–1.2)
Total Protein: 4.6 g/dL — ABNORMAL LOW (ref 6.5–8.1)

## 2022-05-03 LAB — CULTURE, OB URINE: Culture: >=100000 — AB

## 2022-05-03 LAB — CBC WITH DIFFERENTIAL/PLATELET
Abs Immature Granulocytes: 0.08 10*3/uL — ABNORMAL HIGH (ref 0.00–0.07)
Basophils Absolute: 0 10*3/uL (ref 0.0–0.1)
Basophils Relative: 1 %
Eosinophils Absolute: 0 10*3/uL (ref 0.0–0.5)
Eosinophils Relative: 0 %
HCT: 32.1 % — ABNORMAL LOW (ref 36.0–46.0)
Hemoglobin: 10.8 g/dL — ABNORMAL LOW (ref 12.0–15.0)
Immature Granulocytes: 1 %
Lymphocytes Relative: 15 %
Lymphs Abs: 1.2 10*3/uL (ref 0.7–4.0)
MCH: 30.6 pg (ref 26.0–34.0)
MCHC: 33.6 g/dL (ref 30.0–36.0)
MCV: 90.9 fL (ref 80.0–100.0)
Monocytes Absolute: 0.8 10*3/uL (ref 0.1–1.0)
Monocytes Relative: 9 %
Neutro Abs: 6.1 10*3/uL (ref 1.7–7.7)
Neutrophils Relative %: 74 %
Platelets: 257 10*3/uL (ref 150–400)
RBC: 3.53 MIL/uL — ABNORMAL LOW (ref 3.87–5.11)
RDW: 13.8 % (ref 11.5–15.5)
WBC: 8.2 10*3/uL (ref 4.0–10.5)
nRBC: 0 % (ref 0.0–0.2)

## 2022-05-03 LAB — FIBRINOGEN: Fibrinogen: 401 mg/dL (ref 210–475)

## 2022-05-03 LAB — HEPATITIS PANEL, ACUTE
HCV Ab: NONREACTIVE
Hep A IgM: NONREACTIVE
Hep B C IgM: NONREACTIVE
Hepatitis B Surface Ag: NONREACTIVE

## 2022-05-03 LAB — APTT: aPTT: 31 seconds (ref 24–36)

## 2022-05-03 LAB — PROTIME-INR
INR: 1.1 (ref 0.8–1.2)
Prothrombin Time: 14.1 seconds (ref 11.4–15.2)

## 2022-05-03 LAB — LIPASE, BLOOD: Lipase: 19 U/L (ref 11–51)

## 2022-05-03 MED ORDER — SODIUM CHLORIDE 0.9 % IV SOLN
2.0000 g | INTRAVENOUS | Status: DC
  Administered 2022-05-03: 2 g via INTRAVENOUS
  Filled 2022-05-03 (×2): qty 20

## 2022-05-03 MED ORDER — MAGNESIUM SULFATE 40 GM/1000ML IV SOLN
2.0000 g/h | INTRAVENOUS | Status: DC
  Filled 2022-05-03: qty 1000

## 2022-05-03 MED ORDER — HYDRALAZINE HCL 20 MG/ML IJ SOLN: 10.0000 mg | INTRAMUSCULAR | Status: DC | PRN

## 2022-05-03 MED ORDER — METRONIDAZOLE 500 MG/100ML IV SOLN
500.0000 mg | Freq: Two times a day (BID) | INTRAVENOUS | Status: DC
  Administered 2022-05-03 – 2022-05-04 (×3): 500 mg via INTRAVENOUS
  Filled 2022-05-03 (×4): qty 100

## 2022-05-03 MED ORDER — LABETALOL HCL 5 MG/ML IV SOLN: 20.0000 mg | INTRAVENOUS | Status: DC | PRN

## 2022-05-03 MED ORDER — LABETALOL HCL 5 MG/ML IV SOLN: 40.0000 mg | INTRAVENOUS | Status: DC | PRN

## 2022-05-03 MED ORDER — ALUM & MAG HYDROXIDE-SIMETH 200-200-20 MG/5ML PO SUSP
30.0000 mL | Freq: Once | ORAL | Status: AC
Start: 2022-05-03 — End: 2022-05-03
  Administered 2022-05-03: 30 mL via ORAL
  Filled 2022-05-03: qty 30

## 2022-05-03 MED ORDER — CEFADROXIL 1 G PO TABS: 1.0000 g | ORAL_TABLET | Freq: Two times a day (BID) | ORAL | 0 refills | Status: AC

## 2022-05-03 MED ORDER — CEFADROXIL 500 MG PO CAPS
1000.0000 mg | ORAL_CAPSULE | Freq: Two times a day (BID) | ORAL | Status: DC
  Filled 2022-05-03: qty 2

## 2022-05-03 MED ORDER — MAGNESIUM SULFATE BOLUS VIA INFUSION
4.0000 g | Freq: Once | INTRAVENOUS | Status: AC
  Administered 2022-05-03: 4 g via INTRAVENOUS
  Filled 2022-05-03: qty 1000

## 2022-05-03 MED ORDER — LACTATED RINGERS IV SOLN: INTRAVENOUS | Status: DC

## 2022-05-03 MED ORDER — LABETALOL HCL 5 MG/ML IV SOLN: 80.0000 mg | INTRAVENOUS | Status: DC | PRN

## 2022-05-03 NOTE — Progress Notes (Signed)
Jordan Mendez 36 y.o. [id number] PPD#6 s/p SVD HD#2 admitted with fever   Admitted with fever, left flank pain - suspected pyelonephritis. Blood culture and urine culture growing E. Coli, pansensitive. Has been clinically improving on IV Rocephin.  Last fever 102.66F on admission, has been afebrile since. Flank pain resolved.   New complaint this AM of upper abdominal pain and feels bloated. Denies nausea/vomiting. Had bowel movement yesterday. Passed gas yesterday but hasn't passed gas today.   O: Vitals:   05/02/22 2312 05/03/22 0337 05/03/22 0344 05/03/22 0759  BP: 103/63 126/77  123/62  Pulse: 65 (!) 57  (!) 52  Resp: 16 18  17   Temp: 98.5 F (36.9 C) 98 F (36.7 C) 98.5 F (36.9 C) 98.4 F (36.9 C)  TempSrc: Oral Oral Oral Oral  SpO2: 100% 100%  97%  Weight:      Height:       Gen: well appearing CVS: normal pulses Lungs: nonlabored respirations Abd: soft, minimally distended, bilateral upper quadrant and mid epigastric tenderness to palpation, no rebound or guarding Back: no CVA tenderness Ext: no calf edema or tenderness  A/p: HD#2 and PPD#6, admitted with left pyelonephritis and bacteremia - Urine culture and blood culture: E.coli, pansensitive. Has responded well to IV rocephin. Transition to PO cefadroxil 1g q12hr - New upper abdominal pain, distension, unclear etiology at this time: Has been complaining of constipation but was able to have bowel movement yesterday. Hasn't had flatus today, possible gas pain. CBC/CMP/lipase ordered. Continue bowel regimen. Consider imaging if no improvement.  - Dispo: pending abdominal pain workup  05/03/22 9:24 AM

## 2022-05-03 NOTE — Progress Notes (Addendum)
Seen by Dr. Freida Busman (General Surgery) - agrees imaging consistent with acute cholecystitis. Will repeat LFTs in AM and make NPO after midnight. Will continue IV rocephin instead of transitioning to PO abx. Surgery will see patient in AM and decide about surgery.  Now with better explanation for elevated LFTs, I do not believe the patient has severe preeclampsia and will discontinue the IV magnesium. Continue to monitor vital signs.   Alinda Deem, MD 05/03/22 6:46 PM

## 2022-05-03 NOTE — Progress Notes (Addendum)
Pt continuing to complain of epigastric pain.  New BP elevation 153/81, 145/83 CBC normal, CMP shows elevated AST 197, ALT 331. Hasn't received tylenol in last 24 hours. Hepatitis, coag panels and stat RUQ [place] ordered.   With new BP elevations and elevated liver enzymes/RUQ pain, will presume severe preeclampsia and start magnesium for seizure prophylaxis while continuing workup.  Today's Vitals   05/03/22 0946 05/03/22 1046 05/03/22 1148 05/03/22 1206  BP:   (!) 153/81 (!) 145/83  Pulse:   (!) 50 (!) 52  Resp:   16   Temp:   98.2 F (36.8 C)   TempSrc:   Oral   SpO2:   98%   Weight:      Height:      PainSc: 4  4      Body mass index is 26.01 kg/m.   CBC    Component Value Date/Time   WBC 8.2 05/03/2022 0939   RBC 3.53 (L) 05/03/2022 0939   HGB 10.8 (L) 05/03/2022 0939   HCT 32.1 (L) 05/03/2022 0939   PLT 257 05/03/2022 0939   MCV 90.9 05/03/2022 0939   MCH 30.6 05/03/2022 0939   MCHC 33.6 05/03/2022 0939   RDW 13.8 05/03/2022 0939   LYMPHSABS 1.2 05/03/2022 0939   MONOABS 0.8 05/03/2022 0939   EOSABS 0.0 05/03/2022 0939   BASOSABS 0.0 05/03/2022 0939   CMP     Component Value Date/Time   NA 138 05/03/2022 0939   K 4.1 05/03/2022 0939   CL 110 05/03/2022 0939   CO2 21 (L) 05/03/2022 0939   GLUCOSE 76 05/03/2022 0939   BUN 11 05/03/2022 0939   CREATININE 0.56 05/03/2022 0939   CALCIUM 7.5 (L) 05/03/2022 0939   PROT 4.6 (L) 05/03/2022 0939   ALBUMIN 2.1 (L) 05/03/2022 0939   AST 197 (H) 05/03/2022 0939   ALT 331 (H) 05/03/2022 0939   ALKPHOS 146 (H) 05/03/2022 0939   BILITOT 0.5 05/03/2022 0939   GFRNONAA >60 05/03/2022 0939     M. Timothy Lasso, MD 05/03/22 12:36 PM

## 2022-05-03 NOTE — Progress Notes (Addendum)
Pt still with RUQ/epigastric pain, though slightly improved from this AM. Able to tolerate PO without nausea/vomiting.   To recap: PPD#6 from SVD, admitted 7/2 for left pyelonephritis treated with IV rocephin. Clinically improving until this morning complained of severe RUQ/epigastric pain and distention. CMP showed elevated liver enzymes (ALT 331 and AST 197). She demonstrated new mild range blood pressures at the time, so was presumed to have severe preeclampsia and was started on magnesium for seizure prophylaxis while additional workup was completed.     Blood pressures normal now on IV magnesium Hepatitis panel negative, coag panel negative.  RUQ [place]:  1. Marked gallbladder wall thickening and edema with associated pericholecystic fluid. Negative sonographic Murphy sign with patient premedication unclear. Findings concerning for acute cholecystitis. Correlate clinically. 2.  Bilateral at least trace pleural effusions. 3. Small volume free fluid ascites. 4. Indeterminate 1.5 x 1.3 x 1.3 cm left hepatic lobe hyperdense lesion that could represent a hepatic hemangioma.  Call made to General Surgery on call for consultation Patient made NPO for now Will keep IV magnesium on pending consult, as picture still a little unclear  M. Timothy Lasso, MD 05/03/22 4:11 PM

## 2022-05-03 NOTE — Consult Note (Signed)
Jordan Mendez 1985/11/16  [id number].    Requesting MD: Dr. Brien Mates Chief Complaint/Reason for Consult: possible cholecystitis  HPI:  Jordan Mendez is a 36 yo female who is 6 days postpartum, and presented to the [facility] two days ago with fevers and left back pain. She was found to have pyelonephritis and treated with IV antibiotics. She was feeling better until last night, when she began having abdominal pain. The pain is in the epigastric area and radiates to the RUQ. It has persisted into today, and she says she has felt weak all day. Her husband reports she appeared yellow earlier today. Labs are significant for elevated transaminases (AST/ALT 197/331), however the bilirubin is normal at 0.5. WBC  and lipase are also normal. Hepatitis panel was negative. She had mildly elevated BP this morning, which was new, and was started on magnesium to treat for possible preeclampsia. She then had a RUQ [place] this afternoon, which showed significant gallbladder wall thickening but no gallstones or sludge within the gallbladder.  General surgery was consulted.  On my exam the patient says she is still having upper abdominal pain.  She has not eaten today so she is not sure if the pain has been exacerbated by eating. She has never had symptoms like this before.  She is otherwise in good health and has not had any prior abdominal surgeries.  ROS: Review of Systems  Constitutional:  Positive for chills. Negative for fever.  Respiratory:  Negative for shortness of breath.   Gastrointestinal:  Positive for abdominal pain. Negative for nausea and vomiting.    Family History  Problem Relation Age of Onset   Kidney disease Maternal Grandmother    Diabetes Maternal Grandmother     Past Medical History:  Diagnosis Date   Medical history non-contributory     Past Surgical History:  Procedure Laterality Date   BARTHOLIN CYST MARSUPIALIZATION     CLUB FOOT RELEASE     TONSILLECTOMY AND ADENOIDECTOMY       Social History:  reports that she has never smoked. She has never used smokeless tobacco. She reports that she does not currently use alcohol. She reports that she does not use drugs.  Allergies: No Known Allergies  Medications Prior to Admission  Medication Sig Dispense Refill   ibuprofen (ADVIL) 600 MG tablet Take 1 tablet (600 mg total) by mouth every 6 (six) hours. 30 tablet 0   Prenatal Vit-Fe Fumarate-FA (PRENATAL VITAMIN PO) Prenatal Vitamin     acetaminophen (TYLENOL) 325 MG tablet Take 2 tablets (650 mg total) by mouth every 4 (four) hours as needed (for pain scale < 4). 30 tablet 1     Physical Exam: Blood pressure 132/78, pulse 71, temperature 98.5 F (36.9 C), temperature source Oral, resp. rate 20, height $RemoveBe'5\' 2"'IOLSUSEhe$  (1.575 m), weight 64.5 kg, SpO2 98 %, currently breastfeeding. General: resting comfortably, appears stated age, no apparent distress Neurological: alert and oriented, no focal deficits, cranial nerves grossly in tact HEENT: normocephalic, atraumatic, oropharynx clear, no scleral icterus CV: extremities warm and well-perfused Respiratory: normal work of breathing, symmetric chest wall expansion Abdomen: soft, nondistended, focally tender in epigastric area and right upper quadrant, no rebound tenderness or guarding. Extremities: warm and well-perfused, no deformities, moving all extremities spontaneously Psychiatric: normal mood and affect Skin: warm and dry, no jaundice, no rashes or lesions   Results for orders placed or performed during the hospital encounter of 05/01/22 (from the past 48 hour(s))  CBC with Differential/Platelet  Status: Abnormal   Collection Time: 05/03/22  9:39 AM  Result Value Ref Range   WBC 8.2 4.0 - 10.5 K/uL   RBC 3.53 (L) 3.87 - 5.11 MIL/uL   Hemoglobin 10.8 (L) 12.0 - 15.0 g/dL   HCT 32.1 (L) 36.0 - 46.0 %   MCV 90.9 80.0 - 100.0 fL   MCH 30.6 26.0 - 34.0 pg   MCHC 33.6 30.0 - 36.0 g/dL   RDW 13.8 11.5 - 15.5 %    Platelets 257 150 - 400 K/uL   nRBC 0.0 0.0 - 0.2 %   Neutrophils Relative % 74 %   Neutro Abs 6.1 1.7 - 7.7 K/uL   Lymphocytes Relative 15 %   Lymphs Abs 1.2 0.7 - 4.0 K/uL   Monocytes Relative 9 %   Monocytes Absolute 0.8 0.1 - 1.0 K/uL   Eosinophils Relative 0 %   Eosinophils Absolute 0.0 0.0 - 0.5 K/uL   Basophils Relative 1 %   Basophils Absolute 0.0 0.0 - 0.1 K/uL   Immature Granulocytes 1 %   Abs Immature Granulocytes 0.08 (H) 0.00 - 0.07 K/uL    Comment: Performed at [facility], [address], [place], [place] [address]  Comprehensive metabolic panel     Status: Abnormal   Collection Time: 05/03/22  9:39 AM  Result Value Ref Range   Sodium 138 135 - 145 mmol/L   Potassium 4.1 3.5 - 5.1 mmol/L   Chloride 110 98 - 111 mmol/L   CO2 21 (L) 22 - 32 mmol/L   Glucose, Bld 76 70 - 99 mg/dL    Comment: Glucose reference range applies only to samples taken after fasting for at least 8 hours.   BUN 11 6 - 20 mg/dL   Creatinine, Ser 0.56 0.44 - 1.00 mg/dL   Calcium 7.5 (L) 8.9 - 10.3 mg/dL   Total Protein 4.6 (L) 6.5 - 8.1 g/dL   Albumin 2.1 (L) 3.5 - 5.0 g/dL   AST 197 (H) 15 - 41 U/L   ALT 331 (H) 0 - 44 U/L   Alkaline Phosphatase 146 (H) 38 - 126 U/L   Total Bilirubin 0.5 0.3 - 1.2 mg/dL   GFR, Estimated >60 >60 mL/min    Comment: (NOTE) Calculated using the CKD-EPI Creatinine Equation (2021)    Anion gap 7 5 - 15    Comment: Performed at [facility], [place] [address], [place], [place] [address]  Lipase, blood     Status: None   Collection Time: 05/03/22  9:39 AM  Result Value Ref Range   Lipase 19 11 - 51 U/L    Comment: Performed at [facility] [address], [place], [place] [address]  Hepatitis panel, acute     Status: None   Collection Time: 05/03/22  9:39 AM  Result Value Ref Range   Hepatitis B Surface Ag NON REACTIVE NON REACTIVE   HCV Ab NON REACTIVE NON REACTIVE    Comment: (NOTE) Nonreactive HCV antibody screen is consistent with no  HCV infections,  unless recent infection is suspected or other evidence exists to indicate HCV infection.     Hep A IgM NON REACTIVE NON REACTIVE   Hep B C IgM NON REACTIVE NON REACTIVE    Comment: Performed at [facility], [place] [address], [place], [place] [address]  Protime-INR (coagulopathy lab panel)     Status: None   Collection Time: 05/03/22  1:25 PM  Result Value Ref Range  Prothrombin Time 14.1 11.4 - 15.2 seconds   INR 1.1 0.8 - 1.2    Comment: (NOTE) INR goal varies based on device and disease states. Performed at [facility], [place] [address], [place], [place] [address]   Fibrinogen (coagulopathy lab panel)     Status: None   Collection Time: 05/03/22  1:25 PM  Result Value Ref Range   Fibrinogen 401 210 - 475 mg/dL    Comment: (NOTE) Fibrinogen results may be underestimated in patients receiving thrombolytic therapy. Performed at [facility], [place] [address] [place], [place] [address]   APTT (coagulopathy lab panel)     Status: None   Collection Time: 05/03/22  1:25 PM  Result Value Ref Range   aPTT 31 24 - 36 seconds    Comment: Performed at [place] [address], [place], [place] [address]   US Abdomen Limited RUQ (LIVER/GB)  Result Date: 05/03/2022 CLINICAL DATA:  Right upper quadrant pain, elevated liver function test. New hypertension. Delivery of an infant 04/27/2022. NPO for 2 hours. EXAM: ULTRASOUND ABDOMEN LIMITED RIGHT UPPER QUADRANT COMPARISON:  None Available. FINDINGS: Gallbladder: No gallstones or gallbladder sludge. Marked gallbladder wall thickening (8 mm) and edema. Pericholecystic fluid. Tender in the epigastric area with no definite sonographic Murphy sign noted by sonographer. Common bile duct: Diameter: 2 mm. Liver: There is a 1.5 x 1.3 x 1.3 cm left hepatic lobe hyperdense lesion. Within normal limits in parenchymal echogenicity. Portal vein is patent on color Doppler imaging with normal direction of blood flow  towards the liver. Other: Bilateral at least trace pleural effusions. IMPRESSION: 1. Marked gallbladder wall thickening and edema with associated pericholecystic fluid. Negative sonographic Murphy sign with patient premedication unclear. Findings concerning for acute cholecystitis. Correlate clinically. 2.  Bilateral at least trace pleural effusions. 3. Small volume free fluid ascites. 4. Indeterminate 1.5 x 1.3 x 1.3 cm left hepatic lobe hyperdense lesion that could represent a hepatic hemangioma. Electronically Signed   By: Iven Finn M.D.   On: 05/03/2022 15:26      Assessment/Plan This is a 36 year old female who is 6 days postpartum and admitted for pyelonephritis, with acute onset epigastric and right upper quadrant pain.  I personally reviewed her ultrasound, which shows significant gallbladder wall thickening as well as pericholecystic fluid.  These findings are consistent with acute cholecystitis, however in a patient who is otherwise healthy and not critically ill it is unusual to have acalculous cholecystitis.  I discussed with Dr. Brien Mates regarding whether her transaminitis could possibly be caused by preeclampsia; her BP seems to have improved since this morning, and she felt that it would be unusual to have preeclampsia at this point with no prior symptoms. Given the patient's pain, elevated LFTs, and abnormal [place], I recommend treating for acute cholecystitis. - Repeat LFTs in am. If rising bilirubin or alk phos, need to consider MRCP to rule out choledocholithiasis. - Treat for cholecystitis with Rocephin - Tentatively plan for cholecystectomy tomorrow if pain persists, and if OB team feels preeclampsia has been ruled and it is safe to proceed with general anesthesia. - Ok for clear liquids today, keep NPO after midnight - General surgery will follow   Michaelle Birks, [facility] General, Hepatobiliary and Pancreatic Surgery 05/03/22 6:17 PM

## 2022-05-04 ENCOUNTER — Other Ambulatory Visit: Payer: Self-pay

## 2022-05-04 ENCOUNTER — Encounter (HOSPITAL_COMMUNITY)
Admission: AD | Disposition: A | Discharge status: Home / Self Care | Payer: Self-pay | Source: Home / Self Care | Attending: Obstetrics and Gynecology

## 2022-05-04 ENCOUNTER — Encounter (HOSPITAL_COMMUNITY): Payer: Self-pay | Admitting: Obstetrics and Gynecology

## 2022-05-04 ENCOUNTER — Inpatient Hospital Stay (HOSPITAL_COMMUNITY): Payer: Commercial Managed Care - PPO | Admitting: Certified Registered"

## 2022-05-04 DIAGNOSIS — K819 Cholecystitis, unspecified: Secondary | ICD-10-CM

## 2022-05-04 HISTORY — PX: CHOLECYSTECTOMY: SHX55

## 2022-05-04 LAB — CBC WITH DIFFERENTIAL/PLATELET
Abs Immature Granulocytes: 0.04 10*3/uL (ref 0.00–0.07)
Basophils Absolute: 0 10*3/uL (ref 0.0–0.1)
Basophils Relative: 1 %
Eosinophils Absolute: 0 10*3/uL (ref 0.0–0.5)
Eosinophils Relative: 1 %
HCT: 32.3 % — ABNORMAL LOW (ref 36.0–46.0)
Hemoglobin: 10.6 g/dL — ABNORMAL LOW (ref 12.0–15.0)
Immature Granulocytes: 1 %
Lymphocytes Relative: 24 %
Lymphs Abs: 1.4 10*3/uL (ref 0.7–4.0)
MCH: 29.9 pg (ref 26.0–34.0)
MCHC: 32.8 g/dL (ref 30.0–36.0)
MCV: 91.2 fL (ref 80.0–100.0)
Monocytes Absolute: 0.7 10*3/uL (ref 0.1–1.0)
Monocytes Relative: 12 %
Neutro Abs: 3.6 10*3/uL (ref 1.7–7.7)
Neutrophils Relative %: 61 %
Platelets: 251 10*3/uL (ref 150–400)
RBC: 3.54 MIL/uL — ABNORMAL LOW (ref 3.87–5.11)
RDW: 13.7 % (ref 11.5–15.5)
WBC: 5.8 10*3/uL (ref 4.0–10.5)
nRBC: 0 % (ref 0.0–0.2)

## 2022-05-04 LAB — CULTURE, BLOOD (ROUTINE X 2): Special Requests: ADEQUATE

## 2022-05-04 LAB — COMPREHENSIVE METABOLIC PANEL
ALT: 677 U/L — ABNORMAL HIGH (ref 0–44)
AST: 352 U/L — ABNORMAL HIGH (ref 15–41)
Albumin: 2 g/dL — ABNORMAL LOW (ref 3.5–5.0)
Alkaline Phosphatase: 124 U/L (ref 38–126)
Anion gap: 6 (ref 5–15)
BUN: 8 mg/dL (ref 6–20)
CO2: 22 mmol/L (ref 22–32)
Calcium: 6.5 mg/dL — ABNORMAL LOW (ref 8.9–10.3)
Chloride: 110 mmol/L (ref 98–111)
Creatinine, Ser: 0.59 mg/dL (ref 0.44–1.00)
GFR, Estimated: >60 mL/min (ref >60)
Glucose, Bld: 82 mg/dL (ref 70–99)
Potassium: 4 mmol/L (ref 3.5–5.1)
Sodium: 138 mmol/L (ref 135–145)
Total Bilirubin: 0.5 mg/dL (ref 0.3–1.2)
Total Protein: 4.6 g/dL — ABNORMAL LOW (ref 6.5–8.1)

## 2022-05-04 SURGERY — LAPAROSCOPIC CHOLECYSTECTOMY WITH INTRAOPERATIVE CHOLANGIOGRAM
Anesthesia: General

## 2022-05-04 MED ORDER — ONDANSETRON HCL 4 MG/2ML IJ SOLN
INTRAMUSCULAR | Status: DC | PRN
  Administered 2022-05-04: 4 mg via INTRAVENOUS

## 2022-05-04 MED ORDER — METHOCARBAMOL 750 MG PO TABS: 750.0000 mg | ORAL_TABLET | Freq: Four times a day (QID) | ORAL | 1 refills | Status: AC

## 2022-05-04 MED ORDER — PROPOFOL 10 MG/ML IV BOLUS
INTRAVENOUS | Status: DC | PRN
  Administered 2022-05-04: 50 mg via INTRAVENOUS
  Administered 2022-05-04: 150 mg via INTRAVENOUS

## 2022-05-04 MED ORDER — LIDOCAINE 2% (20 MG/ML) 5 ML SYRINGE
INTRAMUSCULAR | Status: DC | PRN
  Administered 2022-05-04: 60 mg via INTRAVENOUS

## 2022-05-04 MED ORDER — DOCUSATE SODIUM 100 MG PO CAPS: 100.0000 mg | ORAL_CAPSULE | Freq: Two times a day (BID) | ORAL | 2 refills | Status: AC

## 2022-05-04 MED ORDER — MIDAZOLAM HCL 2 MG/2ML IJ SOLN
INTRAMUSCULAR | Status: DC | PRN
  Administered 2022-05-04: 2 mg via INTRAVENOUS

## 2022-05-04 MED ORDER — MIDAZOLAM HCL 2 MG/2ML IJ SOLN
INTRAMUSCULAR | Status: AC
  Filled 2022-05-04: qty 2

## 2022-05-04 MED ORDER — SUCCINYLCHOLINE CHLORIDE 200 MG/10ML IV SOSY
PREFILLED_SYRINGE | INTRAVENOUS | Status: DC | PRN
  Administered 2022-05-04: 100 mg via INTRAVENOUS

## 2022-05-04 MED ORDER — SODIUM CHLORIDE 0.9 % IR SOLN
Status: DC | PRN
  Administered 2022-05-04: 1000 mL

## 2022-05-04 MED ORDER — 0.9 % SODIUM CHLORIDE (POUR BTL) OPTIME
TOPICAL | Status: DC | PRN
  Administered 2022-05-04: 1000 mL

## 2022-05-04 MED ORDER — LACTATED RINGERS IV SOLN: INTRAVENOUS | Status: DC | PRN

## 2022-05-04 MED ORDER — PROPOFOL 10 MG/ML IV BOLUS
INTRAVENOUS | Status: AC
  Filled 2022-05-04: qty 20

## 2022-05-04 MED ORDER — LACTATED RINGERS IV SOLN: INTRAVENOUS | Status: DC

## 2022-05-04 MED ORDER — FENTANYL CITRATE (PF) 250 MCG/5ML IJ SOLN
INTRAMUSCULAR | Status: AC
  Filled 2022-05-04: qty 5

## 2022-05-04 MED ORDER — SUGAMMADEX SODIUM 200 MG/2ML IV SOLN
INTRAVENOUS | Status: DC | PRN
  Administered 2022-05-04: 260 mg via INTRAVENOUS

## 2022-05-04 MED ORDER — CHLORHEXIDINE GLUCONATE 0.12 % MT SOLN
OROMUCOSAL | Status: AC
  Administered 2022-05-04: 15 mL
  Filled 2022-05-04: qty 15

## 2022-05-04 MED ORDER — LIDOCAINE HCL (PF) 1 % IJ SOLN
INTRAMUSCULAR | Status: AC
  Filled 2022-05-04: qty 30

## 2022-05-04 MED ORDER — ORAL CARE MOUTH RINSE: 15.0000 mL | Freq: Once | OROMUCOSAL | Status: DC

## 2022-05-04 MED ORDER — LIDOCAINE HCL (PF) 1 % IJ SOLN
INTRAMUSCULAR | Status: DC | PRN
  Administered 2022-05-04: 25 mL

## 2022-05-04 MED ORDER — DEXAMETHASONE SODIUM PHOSPHATE 10 MG/ML IJ SOLN
INTRAMUSCULAR | Status: DC | PRN
  Administered 2022-05-04: 10 mg via INTRAVENOUS

## 2022-05-04 MED ORDER — ROCURONIUM BROMIDE 10 MG/ML (PF) SYRINGE
PREFILLED_SYRINGE | INTRAVENOUS | Status: DC | PRN
  Administered 2022-05-04: 50 mg via INTRAVENOUS

## 2022-05-04 MED ORDER — IBUPROFEN 600 MG PO TABS: 600.0000 mg | ORAL_TABLET | Freq: Four times a day (QID) | ORAL | 1 refills | Status: AC

## 2022-05-04 MED ORDER — METHOCARBAMOL 500 MG PO TABS
1000.0000 mg | ORAL_TABLET | Freq: Three times a day (TID) | ORAL | Status: DC
  Administered 2022-05-04 – 2022-05-06 (×5): 1000 mg via ORAL
  Filled 2022-05-04 (×6): qty 2

## 2022-05-04 MED ORDER — BUPIVACAINE-EPINEPHRINE (PF) 0.25% -1:200000 IJ SOLN
INTRAMUSCULAR | Status: AC
  Filled 2022-05-04: qty 30

## 2022-05-04 MED ORDER — OXYCODONE HCL 5 MG PO TABS: 5.0000 mg | ORAL_TABLET | ORAL | 0 refills | Status: DC | PRN

## 2022-05-04 MED ORDER — SUGAMMADEX SODIUM 500 MG/5ML IV SOLN
INTRAVENOUS | Status: AC
  Filled 2022-05-04: qty 5

## 2022-05-04 MED ORDER — CHLORHEXIDINE GLUCONATE 0.12 % MT SOLN: 15.0000 mL | Freq: Once | OROMUCOSAL | Status: DC

## 2022-05-04 MED ORDER — ACETAMINOPHEN 500 MG PO TABS: 1000.0000 mg | ORAL_TABLET | Freq: Four times a day (QID) | ORAL | 3 refills | Status: AC | PRN

## 2022-05-04 MED ORDER — FENTANYL CITRATE (PF) 250 MCG/5ML IJ SOLN
INTRAMUSCULAR | Status: DC | PRN
Start: 2022-05-04 — End: 2022-05-04
  Administered 2022-05-04 (×2): 100 ug via INTRAVENOUS
  Administered 2022-05-04: 50 ug via INTRAVENOUS

## 2022-05-04 SURGICAL SUPPLY — 45 items
APPLIER CLIP 5 13 M/L LIGAMAX5 (MISCELLANEOUS) ×2
BAG COUNTER SPONGE SURGICOUNT (BAG) ×2 IMPLANT
BLADE CLIPPER SURG (BLADE) IMPLANT
CANISTER SUCT 3000ML PPV (MISCELLANEOUS) ×2 IMPLANT
CHLORAPREP W/TINT 26 (MISCELLANEOUS) ×2 IMPLANT
CLIP APPLIE 5 13 M/L LIGAMAX5 (MISCELLANEOUS) ×1 IMPLANT
CNTNR URN SCR LID CUP LEK RST (MISCELLANEOUS) IMPLANT
CONT SPEC 4OZ STRL OR WHT (MISCELLANEOUS) ×1
COVER MAYO STAND STRL (DRAPES) IMPLANT
COVER SURGICAL LIGHT HANDLE (MISCELLANEOUS) ×2 IMPLANT
DERMABOND ADVANCED (GAUZE/BANDAGES/DRESSINGS) ×1
DERMABOND ADVANCED .7 DNX12 (GAUZE/BANDAGES/DRESSINGS) ×1 IMPLANT
DISSECTOR BLUNT TIP ENDO 5MM (MISCELLANEOUS) ×2 IMPLANT
DRAPE C-ARM 42X120 X-RAY (DRAPES) IMPLANT
ELECT REM PT RETURN 9FT ADLT (ELECTROSURGICAL) ×2
ELECTRODE REM PT RTRN 9FT ADLT (ELECTROSURGICAL) ×1 IMPLANT
GLOVE BIO SURGEON STRL SZ 6.5 (GLOVE) ×2 IMPLANT
GLOVE BIOGEL PI IND STRL 6 (GLOVE) ×1 IMPLANT
GLOVE BIOGEL PI INDICATOR 6 (GLOVE) ×1
GOWN STRL REUS W/ TWL LRG LVL3 (GOWN DISPOSABLE) ×3 IMPLANT
GOWN STRL REUS W/TWL LRG LVL3 (GOWN DISPOSABLE) ×3
KIT BASIN OR (CUSTOM PROCEDURE TRAY) ×2 IMPLANT
KIT TURNOVER KIT B (KITS) ×2 IMPLANT
NS IRRIG 1000ML POUR BTL (IV SOLUTION) ×2 IMPLANT
PAD ARMBOARD 7.5X6 YLW CONV (MISCELLANEOUS) ×2 IMPLANT
POUCH RETRIEVAL ECOSAC 10 (ENDOMECHANICALS) IMPLANT
POUCH RETRIEVAL ECOSAC 10MM (ENDOMECHANICALS) ×1
POUCH SPECIMEN RETRIEVAL 10MM (ENDOMECHANICALS) IMPLANT
SCISSORS LAP 5X35 DISP (ENDOMECHANICALS) ×2 IMPLANT
SET CHOLANGIOGRAPH 5 50 .035 (SET/KITS/TRAYS/PACK) IMPLANT
SET IRRIG TUBING LAPAROSCOPIC (IRRIGATION / IRRIGATOR) IMPLANT
SET TUBE SMOKE EVAC HIGH FLOW (TUBING) ×2 IMPLANT
SLEEVE ENDOPATH XCEL 5M (ENDOMECHANICALS) ×4 IMPLANT
SLEEVE Z-THREAD 5X100MM (TROCAR) ×2 IMPLANT
SPECIMEN JAR SMALL (MISCELLANEOUS) ×2 IMPLANT
SUT MNCRL AB 4-0 PS2 18 (SUTURE) ×2 IMPLANT
SUT VICRYL 0 UR6 27IN ABS (SUTURE) IMPLANT
TOWEL GREEN STERILE (TOWEL DISPOSABLE) ×2 IMPLANT
TOWEL GREEN STERILE FF (TOWEL DISPOSABLE) ×2 IMPLANT
TRAY LAPAROSCOPIC MC (CUSTOM PROCEDURE TRAY) ×2 IMPLANT
TROCAR BALLN 12MMX100 BLUNT (TROCAR) ×1 IMPLANT
TROCAR XCEL BLUNT TIP 100MML (ENDOMECHANICALS) ×2 IMPLANT
TROCAR XCEL NON-BLD 5MMX100MML (ENDOMECHANICALS) ×2 IMPLANT
TROCAR Z-THREAD OPTICAL 5X100M (TROCAR) ×1 IMPLANT
WATER STERILE IRR 1000ML POUR (IV SOLUTION) ×2 IMPLANT

## 2022-05-04 NOTE — Anesthesia Preprocedure Evaluation (Signed)
Anesthesia Evaluation  Patient identified by MRN, date of birth, ID band Patient awake    Reviewed: Allergy & Precautions, NPO status , Patient's Chart, lab work & pertinent test results  Airway Mallampati: II  TM Distance: >3 FB Neck ROM: Full    Dental no notable dental hx.    Pulmonary neg pulmonary ROS,    Pulmonary exam normal        Cardiovascular negative cardio ROS   Rhythm:Regular Rate:Normal     Neuro/Psych negative neurological ROS  negative psych ROS   GI/Hepatic Neg liver ROS, Cholecystitis    Endo/Other  negative endocrine ROS  Renal/GU negative Renal ROS  negative genitourinary   Musculoskeletal negative musculoskeletal ROS (+)   Abdominal Normal abdominal exam  (+)   Peds  Hematology negative hematology ROS (+)   Anesthesia Other Findings   Reproductive/Obstetrics                             Anesthesia Physical Anesthesia Plan  ASA: 2  Anesthesia Plan: General   Post-op Pain Management:    Induction: Intravenous  PONV Risk Score and Plan: 3 and Ondansetron, Dexamethasone, Midazolam and Treatment may vary due to age or medical condition  Airway Management Planned: Mask and Oral ETT  Additional Equipment: None  Intra-op Plan:   Post-operative Plan: Extubation in OR  Informed Consent: I have reviewed the patients History and Physical, chart, labs and discussed the procedure including the risks, benefits and alternatives for the proposed anesthesia with the patient or authorized representative who has indicated his/her understanding and acceptance.     Dental advisory given  Plan Discussed with: CRNA  Anesthesia Plan Comments:         Anesthesia Quick Evaluation

## 2022-05-04 NOTE — Transfer of Care (Signed)
Immediate Anesthesia Transfer of Care Note  Patient: Jordan Mendez  Procedure(s) Performed: LAPAROSCOPIC CHOLECYSTECTOMY  Patient Location: PACU  Anesthesia Type:General  Level of Consciousness: awake, alert  and oriented  Airway & Oxygen Therapy: Patient Spontanous Breathing  Post-op Assessment: Report given to RN and Post -op Vital signs reviewed and stable  Post vital signs: Reviewed and stable  Last Vitals:  Vitals Value Taken Time  BP 136/88 05/04/22 1745  Temp    Pulse 92 05/04/22 1748  Resp 18 05/04/22 1748  SpO2 87 % 05/04/22 1748  Vitals shown include unvalidated device data.  Last Pain:  Vitals:   05/04/22 1318  TempSrc:   PainSc: 0-No pain      Patients Stated Pain Goal: 2 (05/03/22 1046)  Complications: No notable events documented.

## 2022-05-04 NOTE — Discharge Instructions (Signed)
[facility]  LAPAROSCOPIC SURGERY: POST OP INSTRUCTIONS Always review your discharge instruction sheet given to you by the facility where your surgery was performed. IF YOU HAVE DISABILITY OR FAMILY LEAVE FORMS, YOU MUST BRING THEM TO THE OFFICE FOR PROCESSING.   DO NOT GIVE THEM TO YOUR DOCTOR.  PAIN CONTROL  Pain regimen: take over-the-counter tylenol (acetaminophen) 1000mg every six hours, the prescription ibuprofen (600mg) every six hours and the robaxin (methocarbamol) 750mg every six hours. With all three of these, you should be taking something every two hours. Example: tylenol ( acetaminophen) at 8am, ibuprofen at 10am, robaxin (methocarbamol) at 12pm, tylenol (acetaminophen) again at 2pm, ibuprofen again at 4pm, robaxin (methocarbamol) at 6pm. You also have a prescription for oxycodone, which should be taken if the tylenol (acetaminophen), ibuprofen, and robaxin (methocarbamol) are not enough to control your pain. You may take the oxycodone as frequently as every four hours as needed, but if you are taking the other medications as above, you should not need the oxycodone this frequently. You have also been given a prescription for colace (docusate) which is a stool softener. Please take this as prescribed because the oxycodone can cause constipation and the colace (docusate) will minimize or prevent constipation. Do not drive while taking or under the influence of the oxycodone as it is a narcotic medication. Use ice packs to help control pain. If you need a refill on your pain medication, please contact your pharmacy.  They will contact our office to request authorization. Prescriptions will not be filled after 5pm or on week-ends.  HOME MEDICATIONS Take your usually prescribed medications unless otherwise directed.  DIET You should follow a light diet the first few days after arrival home.  Be sure to include lots of fluids daily.   CONSTIPATION It is common to  experience some constipation after surgery and if you are taking pain medication.  Increasing fluid intake and taking a stool softener (such as Colace) will usually help or prevent this problem from occurring.  A mild laxative (Milk of Magnesia or Miralax) should be taken according to package instructions if there are no bowel movements after 48 hours.  WOUND/INCISION CARE Most patients will experience some swelling and bruising in the area of the incisions.  Ice packs will help.  Swelling and bruising can take several days to resolve.  May shower beginning 03/27/22.  Do not peel off or scrub skin glue. May allow warm soapy water to run over incision, then rinse and pat dry.  Do not soak in any water (tubs, hot tubs, pools, lakes, oceans) for one week.   ACTIVITIES You may resume regular (light) daily activities beginning the next day--such as daily self-care, walking, climbing stairs--gradually increasing activities as tolerated.  You may have sexual intercourse when it is comfortable.   No lifting greater than 5 pounds for six weeks.  You may drive when you are no longer taking narcotic pain medication, you can comfortably wear a seatbelt, and you can safely maneuver your car and apply brakes.  FOLLOW-UP You should see your doctor in the office for a follow-up appointment approximately 2-3 weeks after your surgery.  You should have been given your post-op/follow-up appointment when your surgery was scheduled.  If you did not receive a post-op/follow-up appointment, make sure that you call for this appointment within a day or two after you arrive home to insure a convenient appointment time.  WHEN TO CALL YOUR DOCTOR: Fever over 101.5 Inability to urinate   Continued bleeding from incision. Increased pain, redness, or drainage from the incision. Increasing abdominal pain  The clinic staff is available to answer your questions during regular business hours.  Please don't hesitate to call and ask  to speak to one of the nurses for clinical concerns.  If you have a medical emergency, go to the nearest emergency room or call 911.  A surgeon from [facility] is always on call at the hospital. [address], [address], [place], [place]  [address] ? [address] [address], [place], [place]   [address] [phone] ? [phone] ? FAX [phone] Web site: [email or web address]   

## 2022-05-04 NOTE — Progress Notes (Signed)
36 y.o. [id number] PP#7 HD#3 admitted for Fever of unknown origin during puerperium [O86.4].  Pyelo now cholycystitis.  Pt currently stable with no c/o worsening pain.    Vitals:   05/03/22 2137 05/03/22 2238 05/04/22 0005 05/04/22 0437  BP:   (!) 108/59 120/68  Pulse:   (!) 55 (!) 48  Resp:   16   Temp: (!) 101.6 F (38.7 C) (!) 101 F (38.3 C) 98.5 F (36.9 C) 98 F (36.7 C)  TempSrc: Oral Oral Oral Oral  SpO2:   95% 95%  Weight:      Height:        Lungs CTA Cor RRR Abd  Soft, pp, nontender Ex SCDs  Results for orders placed or performed during the hospital encounter of 05/01/22 (from the past 24 hour(s))  CBC with Differential/Platelet     Status: Abnormal   Collection Time: 05/03/22  9:39 AM  Result Value Ref Range   WBC 8.2 4.0 - 10.5 K/uL   RBC 3.53 (L) 3.87 - 5.11 MIL/uL   Hemoglobin 10.8 (L) 12.0 - 15.0 g/dL   HCT 13.0 (L) 86.5 - 78.4 %   MCV 90.9 80.0 - 100.0 fL   MCH 30.6 26.0 - 34.0 pg   MCHC 33.6 30.0 - 36.0 g/dL   RDW 69.6 29.5 - 28.4 %   Platelets 257 150 - 400 K/uL   nRBC 0.0 0.0 - 0.2 %   Neutrophils Relative % 74 %   Neutro Abs 6.1 1.7 - 7.7 K/uL   Lymphocytes Relative 15 %   Lymphs Abs 1.2 0.7 - 4.0 K/uL   Monocytes Relative 9 %   Monocytes Absolute 0.8 0.1 - 1.0 K/uL   Eosinophils Relative 0 %   Eosinophils Absolute 0.0 0.0 - 0.5 K/uL   Basophils Relative 1 %   Basophils Absolute 0.0 0.0 - 0.1 K/uL   Immature Granulocytes 1 %   Abs Immature Granulocytes 0.08 (H) 0.00 - 0.07 K/uL  Comprehensive metabolic panel     Status: Abnormal   Collection Time: 05/03/22  9:39 AM  Result Value Ref Range   Sodium 138 135 - 145 mmol/L   Potassium 4.1 3.5 - 5.1 mmol/L   Chloride 110 98 - 111 mmol/L   CO2 21 (L) 22 - 32 mmol/L   Glucose, Bld 76 70 - 99 mg/dL   BUN 11 6 - 20 mg/dL   Creatinine, Ser 1.32 0.44 - 1.00 mg/dL   Calcium 7.5 (L) 8.9 - 10.3 mg/dL   Total Protein 4.6 (L) 6.5 - 8.1 g/dL   Albumin 2.1 (L) 3.5 - 5.0 g/dL   AST 440 (H) 15 - 41 U/L    ALT 331 (H) 0 - 44 U/L   Alkaline Phosphatase 146 (H) 38 - 126 U/L   Total Bilirubin 0.5 0.3 - 1.2 mg/dL   GFR, Estimated >10 >27 mL/min   Anion gap 7 5 - 15  Lipase, blood     Status: None   Collection Time: 05/03/22  9:39 AM  Result Value Ref Range   Lipase 19 11 - 51 U/L  Hepatitis panel, acute     Status: None   Collection Time: 05/03/22  9:39 AM  Result Value Ref Range   Hepatitis B Surface Ag NON REACTIVE NON REACTIVE   HCV Ab NON REACTIVE NON REACTIVE   Hep A IgM NON REACTIVE NON REACTIVE   Hep B C IgM NON REACTIVE NON REACTIVE  Protime-INR (coagulopathy lab panel)     Status:  None   Collection Time: 05/03/22  1:25 PM  Result Value Ref Range   Prothrombin Time 14.1 11.4 - 15.2 seconds   INR 1.1 0.8 - 1.2  Fibrinogen (coagulopathy lab panel)     Status: None   Collection Time: 05/03/22  1:25 PM  Result Value Ref Range   Fibrinogen 401 210 - 475 mg/dL  APTT (coagulopathy lab panel)     Status: None   Collection Time: 05/03/22  1:25 PM  Result Value Ref Range   aPTT 31 24 - 36 seconds  CBC with Differential/Platelet     Status: Abnormal   Collection Time: 05/04/22  5:03 AM  Result Value Ref Range   WBC 5.8 4.0 - 10.5 K/uL   RBC 3.54 (L) 3.87 - 5.11 MIL/uL   Hemoglobin 10.6 (L) 12.0 - 15.0 g/dL   HCT 93.2 (L) 35.5 - 73.2 %   MCV 91.2 80.0 - 100.0 fL   MCH 29.9 26.0 - 34.0 pg   MCHC 32.8 30.0 - 36.0 g/dL   RDW 20.2 54.2 - 70.6 %   Platelets 251 150 - 400 K/uL   nRBC 0.0 0.0 - 0.2 %   Neutrophils Relative % 61 %   Neutro Abs 3.6 1.7 - 7.7 K/uL   Lymphocytes Relative 24 %   Lymphs Abs 1.4 0.7 - 4.0 K/uL   Monocytes Relative 12 %   Monocytes Absolute 0.7 0.1 - 1.0 K/uL   Eosinophils Relative 1 %   Eosinophils Absolute 0.0 0.0 - 0.5 K/uL   Basophils Relative 1 %   Basophils Absolute 0.0 0.0 - 0.1 K/uL   Immature Granulocytes 1 %   Abs Immature Granulocytes 0.04 0.00 - 0.07 K/uL  Comprehensive metabolic panel     Status: Abnormal   Collection Time: 05/04/22  5:03  AM  Result Value Ref Range   Sodium 138 135 - 145 mmol/L   Potassium 4.0 3.5 - 5.1 mmol/L   Chloride 110 98 - 111 mmol/L   CO2 22 22 - 32 mmol/L   Glucose, Bld 82 70 - 99 mg/dL   BUN 8 6 - 20 mg/dL   Creatinine, Ser 2.37 0.44 - 1.00 mg/dL   Calcium 6.5 (L) 8.9 - 10.3 mg/dL   Total Protein 4.6 (L) 6.5 - 8.1 g/dL   Albumin 2.0 (L) 3.5 - 5.0 g/dL   AST 628 (H) 15 - 41 U/L   ALT 677 (H) 0 - 44 U/L   Alkaline Phosphatase 124 38 - 126 U/L   Total Bilirubin 0.5 0.3 - 1.2 mg/dL   GFR, Estimated >31 >51 mL/min   Anion gap 6 5 - 15    A:  HD#3  PP#7 Pyelo and now cholystasis.  P: LFTs are worse.  Surgery is seeing pt- may choose to operate.  Pt is NPO.  Continued on Rocephin and flagyl.  Loney Laurence

## 2022-05-04 NOTE — Anesthesia Procedure Notes (Signed)
Procedure Name: Intubation Date/Time: 05/04/2022 4:45 PM  Performed by: Babs Bertin, CRNAPre-anesthesia Checklist: Patient identified, Emergency Drugs available, Suction available and Patient being monitored Patient Re-evaluated:Patient Re-evaluated prior to induction Oxygen Delivery Method: Circle System Utilized Preoxygenation: Pre-oxygenation with 100% oxygen Induction Type: IV induction and Rapid sequence Laryngoscope Size: Mac and 3 Grade View: Grade II Tube type: Oral Tube size: 7.0 mm Number of attempts: 1 Airway Equipment and Method: Stylet and Oral airway Placement Confirmation: ETT inserted through vocal cords under direct vision, positive ETCO2 and breath sounds checked- equal and bilateral Tube secured with: Tape Dental Injury: Teeth and Oropharynx as per pre-operative assessment

## 2022-05-04 NOTE — Progress Notes (Signed)
Vitals:   05/04/22 1800 05/04/22 1815 05/04/22 1830 05/04/22 1900  BP: (!) 145/88 (!) 145/88 (!) 150/82 (!) 152/87  Pulse: 67 (!) 55 (!) 54 (!) 50  Resp: 20 19 18 14   Temp:   98.4 F (36.9 C) 98.2 F (36.8 C)  TempSrc:    Oral  SpO2: 94% 95% 96% 96%  Weight:      Height:

## 2022-05-04 NOTE — Progress Notes (Signed)
Okay for discharge from surgery standpoint 05/05/22. No indication for further abx. Recheck LFTs in AM. If persistent elevation in LFTs, recommend f/u with GI as outpatient.   Diamantina Monks, MD General and Trauma Surgery [facility] Surgery

## 2022-05-04 NOTE — Progress Notes (Signed)
General Surgery Follow Up Note  Subjective:    Overnight Issues:   Objective:  Vital signs for last 24 hours: Temp:  [97.9 F (36.6 C)-101.6 F (38.7 C)] 97.9 F (36.6 C) (07/05 0848) Pulse Rate:  [48-74] 50 (07/05 0848) Resp:  [15-22] 15 (07/05 0848) BP: (108-153)/(59-83) 134/72 (07/05 0848) SpO2:  [93 %-100 %] 100 % (07/05 0848)  Hemodynamic parameters for last 24 hours:    Intake/Output from previous day: 07/04 0701 - 07/05 0700 In: 3142.8 [P.O.:570; I.V.:2472.8; IV Piggyback:100] Out: 4100 [Urine:4100]  Intake/Output this shift: No intake/output data recorded.  Vent settings for last 24 hours:    Physical Exam:  Gen: comfortable, no distress Neuro: non-focal exam HEENT: PERRL Neck: supple CV: RRR Pulm: unlabored breathing Abd: soft, RUQ TTP GU: clear yellow urine Extr: wwp, no edema   Results for orders placed or performed during the hospital encounter of 05/01/22 (from the past 24 hour(s))  CBC with Differential/Platelet     Status: Abnormal   Collection Time: 05/03/22  9:39 AM  Result Value Ref Range   WBC 8.2 4.0 - 10.5 K/uL   RBC 3.53 (L) 3.87 - 5.11 MIL/uL   Hemoglobin 10.8 (L) 12.0 - 15.0 g/dL   HCT 32.1 (L) 36.0 - 46.0 %   MCV 90.9 80.0 - 100.0 fL   MCH 30.6 26.0 - 34.0 pg   MCHC 33.6 30.0 - 36.0 g/dL   RDW 13.8 11.5 - 15.5 %   Platelets 257 150 - 400 K/uL   nRBC 0.0 0.0 - 0.2 %   Neutrophils Relative % 74 %   Neutro Abs 6.1 1.7 - 7.7 K/uL   Lymphocytes Relative 15 %   Lymphs Abs 1.2 0.7 - 4.0 K/uL   Monocytes Relative 9 %   Monocytes Absolute 0.8 0.1 - 1.0 K/uL   Eosinophils Relative 0 %   Eosinophils Absolute 0.0 0.0 - 0.5 K/uL   Basophils Relative 1 %   Basophils Absolute 0.0 0.0 - 0.1 K/uL   Immature Granulocytes 1 %   Abs Immature Granulocytes 0.08 (H) 0.00 - 0.07 K/uL  Comprehensive metabolic panel     Status: Abnormal   Collection Time: 05/03/22  9:39 AM  Result Value Ref Range   Sodium 138 135 - 145 mmol/L   Potassium 4.1  3.5 - 5.1 mmol/L   Chloride 110 98 - 111 mmol/L   CO2 21 (L) 22 - 32 mmol/L   Glucose, Bld 76 70 - 99 mg/dL   BUN 11 6 - 20 mg/dL   Creatinine, Ser 0.56 0.44 - 1.00 mg/dL   Calcium 7.5 (L) 8.9 - 10.3 mg/dL   Total Protein 4.6 (L) 6.5 - 8.1 g/dL   Albumin 2.1 (L) 3.5 - 5.0 g/dL   AST 197 (H) 15 - 41 U/L   ALT 331 (H) 0 - 44 U/L   Alkaline Phosphatase 146 (H) 38 - 126 U/L   Total Bilirubin 0.5 0.3 - 1.2 mg/dL   GFR, Estimated >60 >60 mL/min   Anion gap 7 5 - 15  Lipase, blood     Status: None   Collection Time: 05/03/22  9:39 AM  Result Value Ref Range   Lipase 19 11 - 51 U/L  Hepatitis panel, acute     Status: None   Collection Time: 05/03/22  9:39 AM  Result Value Ref Range   Hepatitis B Surface Ag NON REACTIVE NON REACTIVE   HCV Ab NON REACTIVE NON REACTIVE   Hep A IgM NON  REACTIVE NON REACTIVE   Hep B C IgM NON REACTIVE NON REACTIVE  Protime-INR (coagulopathy lab panel)     Status: None   Collection Time: 05/03/22  1:25 PM  Result Value Ref Range   Prothrombin Time 14.1 11.4 - 15.2 seconds   INR 1.1 0.8 - 1.2  Fibrinogen (coagulopathy lab panel)     Status: None   Collection Time: 05/03/22  1:25 PM  Result Value Ref Range   Fibrinogen 401 210 - 475 mg/dL  APTT (coagulopathy lab panel)     Status: None   Collection Time: 05/03/22  1:25 PM  Result Value Ref Range   aPTT 31 24 - 36 seconds  CBC with Differential/Platelet     Status: Abnormal   Collection Time: 05/04/22  5:03 AM  Result Value Ref Range   WBC 5.8 4.0 - 10.5 K/uL   RBC 3.54 (L) 3.87 - 5.11 MIL/uL   Hemoglobin 10.6 (L) 12.0 - 15.0 g/dL   HCT 32.3 (L) 36.0 - 46.0 %   MCV 91.2 80.0 - 100.0 fL   MCH 29.9 26.0 - 34.0 pg   MCHC 32.8 30.0 - 36.0 g/dL   RDW 13.7 11.5 - 15.5 %   Platelets 251 150 - 400 K/uL   nRBC 0.0 0.0 - 0.2 %   Neutrophils Relative % 61 %   Neutro Abs 3.6 1.7 - 7.7 K/uL   Lymphocytes Relative 24 %   Lymphs Abs 1.4 0.7 - 4.0 K/uL   Monocytes Relative 12 %   Monocytes Absolute 0.7 0.1 -  1.0 K/uL   Eosinophils Relative 1 %   Eosinophils Absolute 0.0 0.0 - 0.5 K/uL   Basophils Relative 1 %   Basophils Absolute 0.0 0.0 - 0.1 K/uL   Immature Granulocytes 1 %   Abs Immature Granulocytes 0.04 0.00 - 0.07 K/uL  Comprehensive metabolic panel     Status: Abnormal   Collection Time: 05/04/22  5:03 AM  Result Value Ref Range   Sodium 138 135 - 145 mmol/L   Potassium 4.0 3.5 - 5.1 mmol/L   Chloride 110 98 - 111 mmol/L   CO2 22 22 - 32 mmol/L   Glucose, Bld 82 70 - 99 mg/dL   BUN 8 6 - 20 mg/dL   Creatinine, Ser 0.59 0.44 - 1.00 mg/dL   Calcium 6.5 (L) 8.9 - 10.3 mg/dL   Total Protein 4.6 (L) 6.5 - 8.1 g/dL   Albumin 2.0 (L) 3.5 - 5.0 g/dL   AST 352 (H) 15 - 41 U/L   ALT 677 (H) 0 - 44 U/L   Alkaline Phosphatase 124 38 - 126 U/L   Total Bilirubin 0.5 0.3 - 1.2 mg/dL   GFR, Estimated >60 >60 mL/min   Anion gap 6 5 - 15    Assessment & Plan: The plan of care was discussed with the bedside nurse for the day, who is in agreement with this plan and no additional concerns were raised.   Present on Admission:  Fever of unknown origin during puerperium    LOS: 3 days   Additional comments:I reviewed the patient's new clinical lab test results.   and I reviewed the patients new imaging test results.    Acute cholecystitis - plan for lap chole today. Informed consent was obtained after detailed explanation of risks, including bleeding, infection, biloma, hematoma, injury to common bile duct, need for IOC to delineate anatomy, and need for conversion to open procedure. All questions answered to the patient's satisfaction. Transaminitis -  worsening unlikely to be biliary in etiology as Tbili is normal and alk phos has normalized. Hepatitis panel is also normal. I recognize her platelet count is normal, however I believe HELLP should remain on the differential FEN - NPO for surgery DVT - SCDs, recommend LMWH while inpatient as peri-partum period can be associated with  hypercoagulability Dispo - medsurg   Informed consent obtained in Spanish using Spanish interpreter, [place] [id number]  Jesusita Oka, MD Trauma & General Surgery Please use [email or web address] to contact on call provider  05/04/2022  *Care during the described time interval was provided by me. I have reviewed this patient's available data, including medical history, events of note, physical examination and test results as part of my evaluation.

## 2022-05-04 NOTE — Op Note (Signed)
   Operative Note  Date: 05/04/2022  Procedure: laparoscopic cholecystectomy, umbilical hernia repair  Pre-op diagnosis: acute cholecystitis Post-op diagnosis: same  Indication and clinical history: The patient is a 36 y.o. year old female with acute cholecystitis  Surgeon: Diamantina Monks, MD  Anesthesiologist: Nance Pew, MD Anesthesia: General  Findings:  Specimen: gallbladder EBL: <5cc Drains/Implants: none  Disposition: PACU - hemodynamically stable.  Description of procedure: The patient was positioned supine on the operating room table. Time-out was performed verifying correct patient, procedure, signature of informed consent, and administration of pre-operative antibiotics. General anesthetic induction and intubation were uneventful. The abdomen was prepped and draped in the usual sterile fashion. An umbilical incision was made through a small pre-existing umbilical hernia using an open technique using zero vicryl stay sutures on either side of the fascia and a 63mm Hassan port inserted. After establishing pneumoperitoneum, which the patient tolerated well, the abdominal cavity was inspected and no injury of any intra-abdominal structures was identified. Additional ports were placed under direct visualization and using local anesthetic: two 63mm ports in the right subcostal region and a 50mm port in the epigastric region. The patient was re-positioned to reverse Trendelenburg and right side up. Adhesiolysis was performed to expose the gallbladder, which was then retracted cephalad. The infundibulum was identified and retracted toward the right lower quadrant. The peritoneum was incised over the infundibulum and the triangle of Calot dissected to expose the critical view of safety. With clear identification and isolation of the cystic duct and cystic artery, the cystic artery was doubly clipped and divided. After this, the cystic duct was identified as a single structure entering the  gallbladder, and was also doubly clipped and divided. The gallbladder was dissected off the liver bed using electrocautery and hemostasis of the liver bed was confirmed prior to separation of the final peritoneal attachments of the gallbladder to the liver bed. The gallbladder fossa was irrigated and fluid returned clear. After transection of the final peritoneal attachments, the gallbladder was placed in an endoscopic specimen retrieval bag, removed via the umbilical port site, and sent to pathology as a permanent specimen. The gallbladder fossa was inspected confirming hemostasis, the absence of bile leakage from the cystic duct stump, and correct placement of clips on the cystic artery and cystic duct stumps. The abdomen was desufflated and the fascia of the umbilical port site was closed using the previously placed stay sutures. Additional local anesthetic was administered at the umbilical port site.  The skin of all incisions was closed with 4-0 monocryl. Sterile dressings were applied. All sponge and instrument counts were correct at the conclusion of the procedure. The patient was awakened from anesthesia, extubated uneventfully, and transported to the PACU - hemodynamically stable.. There were no complications.    Upon entering the abdomen (organ space), I encountered infection of the gallbladder .  CASE DATA:  Type of patient?: DOW CASE (Surgical Hospitalist [facility])  Status of Case? URGENT Add On  Infection Present At Time Of Surgery (PATOS)?  INFECTION of the gallbladder    Diamantina Monks, MD General and Trauma Surgery [facility] Surgery

## 2022-05-05 ENCOUNTER — Encounter (HOSPITAL_COMMUNITY): Payer: Self-pay | Admitting: Surgery

## 2022-05-05 LAB — COMPREHENSIVE METABOLIC PANEL
ALT: 523 U/L — ABNORMAL HIGH (ref 0–44)
AST: 129 U/L — ABNORMAL HIGH (ref 15–41)
Albumin: 2 g/dL — ABNORMAL LOW (ref 3.5–5.0)
Alkaline Phosphatase: 141 U/L — ABNORMAL HIGH (ref 38–126)
Anion gap: 9 (ref 5–15)
BUN: 13 mg/dL (ref 6–20)
CO2: 21 mmol/L — ABNORMAL LOW (ref 22–32)
Calcium: 7.5 mg/dL — ABNORMAL LOW (ref 8.9–10.3)
Chloride: 111 mmol/L (ref 98–111)
Creatinine, Ser: 0.59 mg/dL (ref 0.44–1.00)
GFR, Estimated: >60 mL/min (ref >60)
Glucose, Bld: 113 mg/dL — ABNORMAL HIGH (ref 70–99)
Potassium: 4.8 mmol/L (ref 3.5–5.1)
Sodium: 141 mmol/L (ref 135–145)
Total Bilirubin: 0.6 mg/dL (ref 0.3–1.2)
Total Protein: 4.6 g/dL — ABNORMAL LOW (ref 6.5–8.1)

## 2022-05-05 MED ORDER — CEFTRIAXONE SODIUM 2 G IJ SOLR
2.0000 g | INTRAMUSCULAR | Status: DC
Start: 2022-05-05 — End: 2022-05-06
  Administered 2022-05-05: 2 g via INTRAVENOUS
  Filled 2022-05-05 (×2): qty 20

## 2022-05-05 NOTE — Anesthesia Postprocedure Evaluation (Signed)
Anesthesia Post Note  Patient: Jordan Mendez  Procedure(s) Performed: LAPAROSCOPIC CHOLECYSTECTOMY     Patient location during evaluation: PACU Anesthesia Type: General Level of consciousness: awake and alert Pain management: pain level controlled Vital Signs Assessment: post-procedure vital signs reviewed and stable Respiratory status: spontaneous breathing, nonlabored ventilation, respiratory function stable and patient connected to nasal cannula oxygen Cardiovascular status: blood pressure returned to baseline and stable Postop Assessment: no apparent nausea or vomiting Anesthetic complications: no   No notable events documented.  Last Vitals:  Vitals:   05/05/22 0240 05/05/22 0348  BP: 124/77 124/71  Pulse: (!) 49 (!) 47  Resp:  17  Temp:  36.8 C  SpO2:  94%    Last Pain:  Vitals:   05/05/22 0634  TempSrc:   PainSc: 4                  Dshawn Mcnay P Amal Renbarger

## 2022-05-05 NOTE — Progress Notes (Signed)
Patient ID: Jordan Mendez, female   DOB: August 25, 1986, 36 y.o.   MRN: [id number] Late entry PPD# 8; HD#4; POD#1 Pt reports diffuse abdominal tenderness and point tenderness at site of incisions but well controlled with medications. She denies fever, chills, CP, SOB, N/V. No new symptoms overnight.   VS: 131/81 ( 131-168/69-84), 18, 55         T- 98.3 ( Tmax 101 on 05/03/22 @ 2238)  GEN - no acute distress, conversant ABD - mild tympany, guarding on exam            Incisions well approc; healing well EXT - no homans, +1 edema b/l   A/P:  Post chole - recovering well.  -Signed off by General surgery -no antibiotics recommended    2.    Elevated LFTs - trending downwards                - repeat in am                - outpt follow up with GI as needed      AST 129 ( from 352) ; ALT 523 ( from 677)   3.     Elevated BP - improved at last check                 - Trend q 4hrs                  - Treat as indicated    4.    Sepsis - afeb x >24hrs                      - continue on rocephin ( urine and blood cultures were positive)                       - Discuss with pharmacy and can continue on augmentin outpt for uti   5.  Postpartum state - routine pp care

## 2022-05-05 NOTE — Progress Notes (Signed)
Progress Note  1 Day Post-Op  Subjective: Patient reports some mild abdominal soreness. Denies nausea or vomiting. Discussed mobilization. Reviewed post-op care/instructions with patient and husband.  Objective: Vital signs in last 24 hours: Temp:  [97.9 F (36.6 C)-98.4 F (36.9 C)] 98.3 F (36.8 C) (07/06 0348) Pulse Rate:  [47-84] 48 (07/06 0730) Resp:  [14-20] 18 (07/06 0730) BP: (124-168)/(71-88) 156/84 (07/06 0730) SpO2:  [89 %-100 %] 95 % (07/06 0730) Weight:  [64.5 kg] 64.5 kg (07/05 1258) Last BM Date : 05/02/22  Intake/Output from previous day: 07/05 0701 - 07/06 0700 In: 1000 [I.V.:1000] Out: 755 [Urine:750; Blood:5] Intake/Output this shift: No intake/output data recorded.  PE: General: pleasant, WD, WN female who is laying in bed in NAD HEENT: sclera anicteric Heart: regular, rate, and rhythm.   Lungs: CTAB, no wheezes, rhonchi, or rales noted.  Respiratory effort nonlabored Abd: soft, appropriately ttp, ND, +BS, incisions C/D/I Psych: A&Ox3 with an appropriate affect.    Lab Results:  Recent Labs    05/03/22 0939 05/04/22 0503  WBC 8.2 5.8  HGB 10.8* 10.6*  HCT 32.1* 32.3*  PLT 257 251   BMET Recent Labs    05/04/22 0503 05/05/22 0351  NA 138 141  K 4.0 4.8  CL 110 111  CO2 22 21*  GLUCOSE 82 113*  BUN 8 13  CREATININE 0.59 0.59  CALCIUM 6.5* 7.5*   PT/INR Recent Labs    05/03/22 1325  LABPROT 14.1  INR 1.1   CMP     Component Value Date/Time   NA 141 05/05/2022 0351   K 4.8 05/05/2022 0351   CL 111 05/05/2022 0351   CO2 21 (L) 05/05/2022 0351   GLUCOSE 113 (H) 05/05/2022 0351   BUN 13 05/05/2022 0351   CREATININE 0.59 05/05/2022 0351   CALCIUM 7.5 (L) 05/05/2022 0351   PROT 4.6 (L) 05/05/2022 0351   ALBUMIN 2.0 (L) 05/05/2022 0351   AST 129 (H) 05/05/2022 0351   ALT 523 (H) 05/05/2022 0351   ALKPHOS 141 (H) 05/05/2022 0351   BILITOT 0.6 05/05/2022 0351   GFRNONAA >60 05/05/2022 0351   Lipase     Component Value  Date/Time   LIPASE 19 05/03/2022 0939       Studies/Results: US Abdomen Limited RUQ (LIVER/GB)  Result Date: 05/03/2022 CLINICAL DATA:  Right upper quadrant pain, elevated liver function test. New hypertension. Delivery of an infant 04/27/2022. NPO for 2 hours. EXAM: ULTRASOUND ABDOMEN LIMITED RIGHT UPPER QUADRANT COMPARISON:  None Available. FINDINGS: Gallbladder: No gallstones or gallbladder sludge. Marked gallbladder wall thickening (8 mm) and edema. Pericholecystic fluid. Tender in the epigastric area with no definite sonographic Murphy sign noted by sonographer. Common bile duct: Diameter: 2 mm. Liver: There is a 1.5 x 1.3 x 1.3 cm left hepatic lobe hyperdense lesion. Within normal limits in parenchymal echogenicity. Portal vein is patent on color Doppler imaging with normal direction of blood flow towards the liver. Other: Bilateral at least trace pleural effusions. IMPRESSION: 1. Marked gallbladder wall thickening and edema with associated pericholecystic fluid. Negative sonographic Murphy sign with patient premedication unclear. Findings concerning for acute cholecystitis. Correlate clinically. 2.  Bilateral at least trace pleural effusions. 3. Small volume free fluid ascites. 4. Indeterminate 1.5 x 1.3 x 1.3 cm left hepatic lobe hyperdense lesion that could represent a hepatic hemangioma. Electronically Signed   By: Tish Frederickson M.D.   On: 05/03/2022 15:26    Anti-infectives: Anti-infectives (From admission, onward)    Start  Dose/Rate Route Frequency Ordered Stop   05/03/22 2200  metroNIDAZOLE (FLAGYL) IVPB 500 mg        500 mg 100 mL/hr over 60 Minutes Intravenous Every 12 hours 05/03/22 2058     05/03/22 1830  cefTRIAXone (ROCEPHIN) 2 g in sodium chloride 0.9 % 100 mL IVPB  Status:  Discontinued        2 g 200 mL/hr over 30 Minutes Intravenous Every 24 hours 05/03/22 1640 05/04/22 1731   05/03/22 1800  cefadroxil (DURICEF) capsule 1,000 mg  Status:  Discontinued        1,000  mg Oral 2 times daily 05/03/22 1012 05/03/22 1640   05/03/22 0000  cefadroxil (DURICEF) 1 g tablet        1 g Oral 2 times daily 05/03/22 0815 05/13/22 2359   05/02/22 1830  cefTRIAXone (ROCEPHIN) 2 g in sodium chloride 0.9 % 100 mL IVPB  Status:  Discontinued        2 g 200 mL/hr over 30 Minutes Intravenous Every 24 hours 05/02/22 1008 05/03/22 1012   05/01/22 1830  cefTRIAXone (ROCEPHIN) 1 g in sodium chloride 0.9 % 100 mL IVPB  Status:  Discontinued        1 g 200 mL/hr over 30 Minutes Intravenous Every 24 hours 05/01/22 1800 05/02/22 1008        Assessment/Plan POD1 S/p laparoscopic cholecystectomy  - tolerating diet - encourage mobilization  - ok to shower - LFTs trending down overall, but would recommend recheck with PCP/OB in 2-3 weeks - follow up and instructions in AVS and Rx for pain medication sent to pharmacy - pt is cleared for discharge from a general surgery standpoint   FEN: reg diet, IVF per OB VTE: SCDs, LMWH or SQH ok from a surgical standpoint ID: no further abx needed from a general surgery standpoint   LOS: 4 days     Juliet Rude, [facility] 05/05/2022, 7:56 AM Please see Amion for pager number during day hours 7:00am-4:30pm

## 2022-05-06 LAB — COMPREHENSIVE METABOLIC PANEL
ALT: 337 U/L — ABNORMAL HIGH (ref 0–44)
AST: 54 U/L — ABNORMAL HIGH (ref 15–41)
Albumin: 2.1 g/dL — ABNORMAL LOW (ref 3.5–5.0)
Alkaline Phosphatase: 112 U/L (ref 38–126)
Anion gap: 6 (ref 5–15)
BUN: 11 mg/dL (ref 6–20)
CO2: 23 mmol/L (ref 22–32)
Calcium: 7.7 mg/dL — ABNORMAL LOW (ref 8.9–10.3)
Chloride: 111 mmol/L (ref 98–111)
Creatinine, Ser: 0.66 mg/dL (ref 0.44–1.00)
GFR, Estimated: >60 mL/min (ref >60)
Glucose, Bld: 85 mg/dL (ref 70–99)
Potassium: 4.1 mmol/L (ref 3.5–5.1)
Sodium: 140 mmol/L (ref 135–145)
Total Bilirubin: 0.4 mg/dL (ref 0.3–1.2)
Total Protein: 4.4 g/dL — ABNORMAL LOW (ref 6.5–8.1)

## 2022-05-06 LAB — CULTURE, BLOOD (ROUTINE X 2): Culture: NO GROWTH

## 2022-05-06 LAB — SURGICAL PATHOLOGY

## 2022-05-06 NOTE — Progress Notes (Addendum)
PPD #9, HD #5, POD #2 Feeling better, no specific issues Afeb, VSS, BP mostly normal Abd- soft, incisions intact  CMP     Component Value Date/Time   NA 140 05/06/2022 0506   K 4.1 05/06/2022 0506   CL 111 05/06/2022 0506   CO2 23 05/06/2022 0506   GLUCOSE 85 05/06/2022 0506   BUN 11 05/06/2022 0506   CREATININE 0.66 05/06/2022 0506   CALCIUM 7.7 (L) 05/06/2022 0506   PROT 4.4 (L) 05/06/2022 0506   ALBUMIN 2.1 (L) 05/06/2022 0506   AST 54 (H) 05/06/2022 0506   ALT 337 (H) 05/06/2022 0506   ALKPHOS 112 05/06/2022 0506   BILITOT 0.4 05/06/2022 0506   GFRNONAA >60 05/06/2022 0506    LFTs improving, afebrile, BP stable, will d/c home on Cefadroxil for bacteremia

## 2022-05-07 NOTE — Discharge Summary (Signed)
Physician Discharge Summary  Patient ID: Jordan Mendez MRN: [id number] DOB/AGE: August 09, 1986 36 y.o.  Admit date: 05/01/2022 Discharge date: 05-06-22  Admission Diagnoses:  Postpartum, pyelonephritis  Discharge Diagnoses: Postpartum, pyelonephritis with bacteremia, cholecystitis Principal Problem:   Fever of unknown origin during puerperium   Discharged Condition: good  Hospital Course: Pt admitted with fever, suspected left pyelonephritis, started on Rocephin.  Blood cultures positive for E. Coli and enterobacterales, Rocephin dose increased.  On 7-4, she had new RUQ pain, elevated liver enzymes and mild elevated BP, treated with magnesium for seizure prophylaxis.  Ultrasound c/w cholecystitis.  Had lap chole on 7-5, stable post-op.  Bp gradually improved without treatment, stable for d/c home on 7-7  Consults: general surgery   Discharge Exam: Blood pressure 133/83, pulse (!) 53, temperature 98.4 F (36.9 C), temperature source Oral, resp. rate 17, height 5\' 2"  (1.575 m), weight 64.5 kg, SpO2 99 %, currently breastfeeding. General appearance: alert  Disposition: Discharge disposition: 01-Home or Self Care       Discharge Instructions     Call MD for:  persistant nausea and vomiting   Complete by: As directed    Call MD for:  severe uncontrolled pain   Complete by: As directed    Call MD for:  temperature >100.4   Complete by: As directed    Diet - low sodium heart healthy   Complete by: As directed    Increase activity slowly   Complete by: As directed       Allergies as of 05/06/2022   No Known Allergies      Medication List     TAKE these medications    acetaminophen 500 MG tablet Commonly known as: TYLENOL Take 2 tablets (1,000 mg total) by mouth every 6 (six) hours as needed. What changed:  medication strength how much to take when to take this reasons to take this   cefadroxil 1 g tablet Commonly known as: DURICEF Take 1 tablet (1 g total) by mouth 2  (two) times daily for 10 days.   docusate sodium 100 MG capsule Commonly known as: Colace Take 1 capsule (100 mg total) by mouth 2 (two) times daily.   ibuprofen 600 MG tablet Commonly known as: ADVIL Take 1 tablet (600 mg total) by mouth every 6 (six) hours.   methocarbamol 750 MG tablet Commonly known as: Robaxin-750 Take 1 tablet (750 mg total) by mouth 4 (four) times daily.   oxyCODONE 5 MG immediate release tablet Commonly known as: Roxicodone Take 1 tablet (5 mg total) by mouth every 4 (four) hours as needed.   PRENATAL VITAMIN PO Prenatal Vitamin        Follow-up Information     [facility] 07/07/2022. Go on 05/24/2022.   Specialty: General Surgery Why: 11:45 AM with 05/26/2022, PA-C. Please arrive 30 min prior to appointment time. Have ID and insurance information with you. Contact information: [address] [place] [phone]         [phone], MD. Schedule an appointment as soon as possible for a visit in 1 week(s).   Specialty: Obstetrics and Gynecology Contact information: [address] [place] [phone]                 Signed: [phone] Duwayne Matters 05/07/2022, 6:37 AM

## 2022-05-17 ENCOUNTER — Encounter (HOSPITAL_COMMUNITY): Payer: Self-pay | Admitting: Obstetrics and Gynecology

## 2022-05-17 ENCOUNTER — Inpatient Hospital Stay (HOSPITAL_COMMUNITY)
Admission: AD | Admit: 2022-05-17 | Discharge: 2022-05-17 | Disposition: A | Discharge status: Home / Self Care | Payer: Commercial Managed Care - PPO | Attending: Obstetrics and Gynecology | Admitting: Obstetrics and Gynecology

## 2022-05-17 ENCOUNTER — Other Ambulatory Visit: Payer: Self-pay

## 2022-05-17 DIAGNOSIS — M791 Myalgia, unspecified site: Secondary | ICD-10-CM | POA: Insufficient documentation

## 2022-05-17 DIAGNOSIS — Z9049 Acquired absence of other specified parts of digestive tract: Secondary | ICD-10-CM | POA: Insufficient documentation

## 2022-05-17 DIAGNOSIS — M79602 Pain in left arm: Secondary | ICD-10-CM | POA: Insufficient documentation

## 2022-05-17 DIAGNOSIS — M7918 Myalgia, other site: Secondary | ICD-10-CM

## 2022-05-17 DIAGNOSIS — O9089 Other complications of the puerperium, not elsewhere classified: Secondary | ICD-10-CM | POA: Diagnosis not present

## 2022-05-17 NOTE — MAU Provider Note (Signed)
History     CSN: [id number]  Arrival date and time: 05/17/22 0019   Event Date/Time   First Provider Initiated Contact with Patient 05/17/22 0053      Chief Complaint  Patient presents with   Arm Pain   HPI Jordan Mendez is a 36 y.o. [id number] patient who presents to MAU with chief complaint of left arm pain. This is a new problem, onset today. Patient was breastfeeding her baby when she felt acute onset pain superior to her left elbow. Her pain radiated up her arm and to the back of her neck. Pain then resolved without intervention. Patient states she took her blood pressure during this episode of pain and the systolic reading was 150s. She denies chest pain, palpitations, weakness, syncope.  On arrival to MAU patient's pain score is 1-2/10. She has not taken pain medication. She denies change in grip strength or her ability to perform ADLs. She is not experiencing pain, swelling or erythema at her former IV sites.   Patient is exclusively breastfeeding. She states her left breast is baby's favorite. She typically feeds in left side-lying with her left arm behind her head.   Patient receives care with Nestor Ramp  OB History     Gravida  3   Para  3   Term  3   Preterm      AB      Living  3      SAB      IAB      Ectopic      Multiple  0   Live Births  3           Past Medical History:  Diagnosis Date   Medical history non-contributory     Past Surgical History:  Procedure Laterality Date   BARTHOLIN CYST MARSUPIALIZATION     CHOLECYSTECTOMY N/A 05/04/2022   Procedure: LAPAROSCOPIC CHOLECYSTECTOMY;  Surgeon: Diamantina Monks, MD;  Location: [facility] OR;  Service: General;  Laterality: N/A;   CLUB FOOT RELEASE     TONSILLECTOMY AND ADENOIDECTOMY      Family History  Problem Relation Age of Onset   Kidney disease Maternal Grandmother    Diabetes Maternal Grandmother     Social History   Tobacco Use   Smoking status: Never   Smokeless tobacco: Never   Vaping Use   Vaping Use: Never used  Substance Use Topics   Alcohol use: Not Currently   Drug use: Never    Allergies: No Known Allergies  Medications Prior to Admission  Medication Sig Dispense Refill Last Dose   ibuprofen (ADVIL) 600 MG tablet Take 1 tablet (600 mg total) by mouth every 6 (six) hours. 120 tablet 1 05/16/2022   acetaminophen (TYLENOL) 500 MG tablet Take 2 tablets (1,000 mg total) by mouth every 6 (six) hours as needed. 120 tablet 3    docusate sodium (COLACE) 100 MG capsule Take 1 capsule (100 mg total) by mouth 2 (two) times daily. 60 capsule 2    methocarbamol (ROBAXIN-750) 750 MG tablet Take 1 tablet (750 mg total) by mouth 4 (four) times daily. 120 tablet 1    oxyCODONE (ROXICODONE) 5 MG immediate release tablet Take 1 tablet (5 mg total) by mouth every 4 (four) hours as needed. 30 tablet 0    Prenatal Vit-Fe Fumarate-FA (PRENATAL VITAMIN PO) Prenatal Vitamin       Review of Systems  Musculoskeletal:  Positive for arthralgias, neck pain and neck stiffness.  All other systems reviewed and  are negative.  Physical Exam   Blood pressure 117/70, pulse 63, temperature (!) 97.5 F (36.4 C), temperature source Oral, resp. rate 18, height 5\' 2"  (1.575 m), weight 54.7 kg, SpO2 99 %, currently breastfeeding.  Physical Exam Vitals and nursing note reviewed. Exam conducted with a chaperone present.  Cardiovascular:     Rate and Rhythm: Normal rate and regular rhythm.     Pulses: Normal pulses.     Heart sounds: Normal heart sounds.  Pulmonary:     Effort: Pulmonary effort is normal.     Breath sounds: Normal breath sounds.  Abdominal:     General: Abdomen is flat.  Musculoskeletal:        General: No swelling or tenderness. Normal range of motion.     Right shoulder: Normal. Normal range of motion. Normal strength. Normal pulse.     Left shoulder: Normal. No swelling. Normal range of motion. Normal strength. Normal pulse.     Right upper arm: Normal.     Left  upper arm: Normal.     Right lower leg: No edema.     Left lower leg: No edema.  Skin:    General: Skin is warm.     Capillary Refill: Capillary refill takes less than 2 seconds.  Neurological:     Mental Status: She is alert and oriented to person, place, and time.   IV insertion sites from hospital and postpartum admissions inspected. All sites well-healed with no swelling or bruising  MAU Course  Procedures  MDM  --Reviewed possibility of pain related to old IV site but no evidence of poorly healed insertion sites, no localized skin tissue trauma  --Suggested pain s musculoskeletal pain due to predominant positioning in left lateral  --Normotensive in MAU. Patient declined my offer of one hour serial BP checks  --Patient declined my offer of short course Toradol for muscle pain. Advised to restart postpartum motrin 600 mg q 6 hours PRN  Orders Placed This Encounter  Procedures   Discharge patient   Patient Vitals for the past 24 hrs:  BP Temp Temp src Pulse Resp SpO2 Height Weight  05/17/22 0114 109/76 -- -- 67 -- -- -- --  05/17/22 0035 117/70 (!) 97.5 F (36.4 C) Oral 63 18 99 % 5\' 2"  (1.575 m) 54.7 kg    Assessment and Plan  --36 y.o. [id number] s/p SVD 04/27/22 --S/p hospital readmission 07/02-07-07 for cholecystectomy --Physical exam negative for acute findings, breast exam declined by patient --MSK pain, restart Motrin PRN --Patient is bilingual, remote interpreter utilized to confirm she is comfortable conducting visit in english --Discharge home in stable condition  04/29/22, CNM

## 2022-05-17 NOTE — MAU Note (Signed)
..  Jordan Mendez is a 36 y.o. at [facility] from 04/27/2022 here in MAU reporting: She was breastfeeding her baby and got a pain that began on her elbow and went up to her neck. States that the pain felt like nerve or vein pain, not muscular. The pain has subsided and she only feels discomfort.   During this pain she took her blood pressure and it was 154/104  Pain score: 2/10 Vitals:   05/17/22 0035  BP: 117/70  Pulse: 63  Resp: 18  Temp: (!) 97.5 F (36.4 C)  SpO2: 99%

## 2023-04-13 ENCOUNTER — Ambulatory Visit (INDEPENDENT_AMBULATORY_CARE_PROVIDER_SITE_OTHER): Payer: Commercial Managed Care - PPO | Admitting: Orthopedic Surgery

## 2023-04-13 ENCOUNTER — Other Ambulatory Visit (INDEPENDENT_AMBULATORY_CARE_PROVIDER_SITE_OTHER): Payer: Commercial Managed Care - PPO

## 2023-04-13 ENCOUNTER — Other Ambulatory Visit: Payer: Self-pay

## 2023-04-13 DIAGNOSIS — M79672 Pain in left foot: Secondary | ICD-10-CM | POA: Diagnosis not present

## 2023-04-13 DIAGNOSIS — M79671 Pain in right foot: Secondary | ICD-10-CM

## 2023-04-13 DIAGNOSIS — Q6689 Other  specified congenital deformities of feet: Secondary | ICD-10-CM

## 2023-04-19 ENCOUNTER — Encounter: Payer: Self-pay | Admitting: Orthopedic Surgery

## 2023-04-19 ENCOUNTER — Telehealth: Payer: Self-pay | Admitting: Orthopedic Surgery

## 2023-04-19 NOTE — Telephone Encounter (Signed)
Dr. Lajoyce Corners has dictated note.

## 2023-04-19 NOTE — Telephone Encounter (Signed)
This pt was referred to [facility] for bilat club foot eval with hx of previous surgeries. Can you please check the status of this referral and notify pt?

## 2023-04-19 NOTE — Telephone Encounter (Signed)
Pt's husband Elita Quick called stating he called the number that Autumn F gave him for referral for wife and they stated they didn't received referral. Pt is asking to call them and resend referral and call husband at [phone].

## 2023-04-19 NOTE — Progress Notes (Signed)
Office Visit Note   Patient: Jordan Mendez           Date of Birth: [birth year]           MRN: [id number] Visit Date: 04/13/2023              Requested by: No referring provider defined for this encounter. PCP: Pcp, No  Chief Complaint  Patient presents with   Right Foot - Pain   Left Foot - Pain      HPI: Patient is a 37 year old woman who is seen for initial evaluation for bilateral foot pain status post clubfoot surgery as a child.  Patient states that she had 7 surgeries on the right foot 6 surgeries on the left foot.  Patient has been using a posterior tibial tendon brace which helps at times.  Patient states that over the past year her foot pain is getting worse with difficulty walking.  Assessment & Plan: Visit Diagnoses:  1. Bilateral foot pain   2. Club foot of both lower extremities     Plan: With patient's complex foot deformities status post multiple surgical interventions for clubfoot intervention feel it is best for patient to be evaluated at [facility] to evaluate for foot reconstruction options.  Follow-Up Instructions: Return if symptoms worsen or fail to improve.   Ortho Exam  Patient is alert, oriented, no adenopathy, well-dressed, normal affect, normal respiratory effort. Examination patient has palpable pulses bilaterally.  With her standing the calcaneus is in varus bilaterally.  With weightbearing the first metatarsal head is off the floor and pressure is laterally.  There are multiple incisions around the foot and ankle bilaterally.  Patient has impingement laterally with the fibula pain to palpation over the sinus Tarsi bilaterally.  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  Imaging: No results found. No images are attached to the encounter.  Labs: Lab Results  Component Value Date   REPTSTATUS 05/04/2022 FINAL 05/01/2022   CULT ESCHERICHIA COLI (A) 05/01/2022   LABORGA ESCHERICHIA COLI 05/01/2022     Lab Results   Component Value Date   ALBUMIN 2.1 (L) 05/06/2022   ALBUMIN 2.0 (L) 05/05/2022   ALBUMIN 2.0 (L) 05/04/2022    No results found for: "MG" No results found for: "VD25OH"  No results found for: "PREALBUMIN"    Latest Ref Rng & Units 05/04/2022    5:03 AM 05/03/2022    9:39 AM 05/01/2022    6:14 PM  CBC EXTENDED  WBC 4.0 - 10.5 K/uL 5.8  8.2  10.5   RBC 3.87 - 5.11 MIL/uL 3.54  3.53  3.98   Hemoglobin 12.0 - 15.0 g/dL 40.9  81.1  91.4   HCT 36.0 - 46.0 % 32.3  32.1  36.5   Platelets 150 - 400 K/uL 251  257  274   NEUT# 1.7 - 7.7 K/uL 3.6  6.1  9.8   Lymph# 0.7 - 4.0 K/uL 1.4  1.2  0.4      There is no height or weight on file to calculate BMI.  Orders:  Orders Placed This Encounter  Procedures   XR Foot Complete Right   XR Foot Complete Left   Ambulatory referral to Orthopedic Surgery   No orders of the defined types were placed in this encounter.    Procedures: No procedures performed  Clinical Data: No additional findings.  ROS:  All other systems negative, except as noted in the HPI. Review of Systems  Objective: Vital Signs:  There were no vitals taken for this visit.  Specialty Comments:  No specialty comments available.  PMFS History: Patient Active Problem List   Diagnosis Date Noted   Fever of unknown origin during puerperium 05/01/2022   Pregnant and not yet delivered 04/26/2022   Pregnant and not yet delivered, second trimester 12/04/2021   Pregnancy 07/03/2018   S/P correction of clubfoot 11/20/2017   Past Medical History:  Diagnosis Date   Medical history non-contributory     Family History  Problem Relation Age of Onset   Kidney disease Maternal Grandmother    Diabetes Maternal Grandmother     Past Surgical History:  Procedure Laterality Date   BARTHOLIN CYST MARSUPIALIZATION     CHOLECYSTECTOMY N/A 05/04/2022   Procedure: LAPAROSCOPIC CHOLECYSTECTOMY;  Surgeon: Diamantina Monks, MD;  Location: [facility] OR;  Service: General;  Laterality: N/A;    CLUB FOOT RELEASE     TONSILLECTOMY AND ADENOIDECTOMY     Social History   Occupational History   Not on file  Tobacco Use   Smoking status: Never   Smokeless tobacco: Never  Vaping Use   Vaping Use: Never used  Substance and Sexual Activity   Alcohol use: Not Currently   Drug use: Never   Sexual activity: Not Currently    Birth control/protection: None

## 2023-04-21 NOTE — Telephone Encounter (Signed)
Referral sednt

## 2023-05-02 ENCOUNTER — Ambulatory Visit: Payer: Commercial Managed Care - PPO | Admitting: Medical

## 2023-05-12 ENCOUNTER — Encounter: Payer: Self-pay | Admitting: Medical

## 2023-05-12 ENCOUNTER — Ambulatory Visit: Payer: Commercial Managed Care - PPO | Admitting: Medical

## 2023-05-12 VITALS — BP 104/59 | HR 118 | Temp 98.0°F | Resp 16 | Ht 62.0 in | Wt 123.8 lb

## 2023-05-12 DIAGNOSIS — M26629 Arthralgia of temporomandibular joint, unspecified side: Secondary | ICD-10-CM

## 2023-05-12 DIAGNOSIS — R5383 Other fatigue: Secondary | ICD-10-CM

## 2023-05-12 DIAGNOSIS — Z0000 Encounter for general adult medical examination without abnormal findings: Secondary | ICD-10-CM

## 2023-05-12 LAB — CBC WITH DIFFERENTIAL/PLATELET
Absolute Monocytes: 540 cells/uL (ref 200–950)
Basophils Absolute: 72 cells/uL (ref 0–200)
Basophils Relative: 1 %
Eosinophils Absolute: 108 cells/uL (ref 15–500)
Eosinophils Relative: 1.5 %
Hemoglobin: 13.5 g/dL (ref 11.7–15.5)
MCH: 30.7 pg (ref 27.0–33.0)
MCV: 93 fL (ref 80.0–100.0)
Monocytes Relative: 7.5 %
Neutro Abs: 3982 cells/uL (ref 1500–7800)
RBC: 4.4 10*6/uL (ref 3.80–5.10)
WBC: 7.2 10*3/uL (ref 3.8–10.8)

## 2023-05-12 NOTE — Progress Notes (Signed)
Subjective:    Patient ID: Jordan Mendez, female    DOB: 04-09-1986, 37 y.o.   MRN: [id number]  HPI  Pt in for first time.  She is from [place].  Pt works at [employer]. Pt does not exercise. She has 3 children. All under 36 years old. Pt eats healthy most of time. Non smoker. No alcohol.  She wants physical today.  Pt state other day had harm time closing her mouth after napping in may. Then she states after this has poor aligntment in tmj area.   Admits- recent stress a lot.   Review of Systems  Constitutional:  Positive for fatigue. Negative for chills and fever.  HENT:  Negative for congestion, drooling and ear discharge.   Respiratory:  Negative for cough, chest tightness, shortness of breath and wheezing.   Cardiovascular:  Negative for chest pain and palpitations.  Gastrointestinal:  Negative for abdominal pain, nausea and vomiting.  Musculoskeletal:  Negative for back pain, joint swelling and neck pain.  Skin:  Negative for rash.  Neurological:  Negative for dizziness, seizures, syncope, light-headedness and headaches.  Hematological:  Negative for adenopathy. Does not bruise/bleed easily.  Psychiatric/Behavioral:  Negative for behavioral problems, confusion and sleep disturbance. The patient is not nervous/anxious.        Stress  Currently breast feeding youngest child.  Past Medical History:  Diagnosis Date   Medical history non-contributory      Social History   Socioeconomic History   Marital status: Married    Spouse name: Not on file   Number of children: Not on file   Years of education: Not on file   Highest education level: Not on file  Occupational History   Not on file  Tobacco Use   Smoking status: Never   Smokeless tobacco: Never  Vaping Use   Vaping status: Never Used  Substance and Sexual Activity   Alcohol use: Not Currently   Drug use: Never   Sexual activity: Not Currently    Birth control/protection: None  Other Topics  Concern   Not on file  Social History Narrative   Not on file   Social Determinants of Health   Financial Resource Strain: Not on file  Food Insecurity: Not on file  Transportation Needs: Not on file  Physical Activity: Not on file  Stress: Not on file  Social Connections: Not on file  Intimate Partner Violence: Not on file    Past Surgical History:  Procedure Laterality Date   BARTHOLIN CYST MARSUPIALIZATION     CHOLECYSTECTOMY N/A 05/04/2022   Procedure: LAPAROSCOPIC CHOLECYSTECTOMY;  Surgeon: Diamantina Monks, MD;  Location: [facility];  Service: General;  Laterality: N/A;   CLUB FOOT RELEASE     TONSILLECTOMY AND ADENOIDECTOMY      Family History  Problem Relation Age of Onset   Kidney disease Maternal Grandmother    Diabetes Maternal Grandmother     No Known Allergies  Current Outpatient Medications on File Prior to Visit  Medication Sig Dispense Refill   ibuprofen (ADVIL) 600 MG tablet Take 1 tablet (600 mg total) by mouth every 6 (six) hours. 120 tablet 1   methocarbamol (ROBAXIN-750) 750 MG tablet Take 1 tablet (750 mg total) by mouth 4 (four) times daily. 120 tablet 1   Prenatal Vit-Fe Fumarate-FA (PRENATAL VITAMIN PO) Prenatal Vitamin     No current facility-administered medications on file prior to visit.    BP (!) 104/59 (BP Location: Left Arm, Patient Position: Sitting, Cuff  Size: Normal)   Pulse (!) 118   Temp 98 F (36.7 C) (Oral)   Resp 16   Ht 5\' 2"  (1.575 m)   Wt 123 lb 12.8 oz (56.2 kg)   SpO2 100%   BMI 22.64 kg/m        Objective:   Physical Exam  General Mental Status- Alert. General Appearance- Not in acute distress.   Skin General: Color- Normal Color. Moisture- Normal Moisture.  Neck Carotid Arteries- Normal color. Moisture- Normal Moisture. No carotid bruits. No JVD.  Chest and Lung Exam Auscultation: Breath Sounds:-Normal.  Cardiovascular Auscultation:Rythm- Regular. Murmurs & Other Heart Sounds:Auscultation of the heart  reveals- No Murmurs.  Abdomen Inspection:-Inspeection Normal. Palpation/Percussion:Note:No mass. Palpation and Percussion of the abdomen reveal- Non Tender, Non Distended + BS, no rebound or guarding.    Neurologic Cranial Nerve exam:- CN III-XII intact(No nystagmus), symmetric smile. Drift Test:- No drift. Romberg Exam:- Negative.  Heal to Toe Gait exam:-Normal. Finger to Nose:- Normal/Intact Strength:- 5/5 equal and symmetric strength both upper and lower extremities.   Rt side jaw on opening and closing mouth has crepitus over her tmj area.    Assessment & Plan:   For you wellness exam today I have ordered cbc, cmp and lipid panel  Tdap up to date  Recommend exercise and healthy diet.  We will let you know lab results as they come in.  Follow up date appointment will be determined after lab review.    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 If you have pain  can use ibuprofen over the counter. Recommend that you make appointment with dentist first. They may advise tmj specialist. I think onset occurred wth former high level stress before family arrived.

## 2023-05-12 NOTE — Patient Instructions (Addendum)
For you wellness exam today I have ordered cbc, cmp and lipid panel. Also fatigue labs.  Tdap up to date  Recommend exercise and healthy diet.  We will let you know lab results as they come in.  Follow up date appointment will be determined after lab review.    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 If you have pain  can use ibuprofen over the counter. Recommend that you make appointment with dentist first. They may advise tmj specialist. I think onset occurred wth former high level stress before family arrived.    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 Estas articulaciones estn ubicadas cerca de las orejas y permiten abrir y Conservation officer, nature. Las Engineer, petroleum por el sndrome de ATM pueden tener dificultades o sentir dolor al Product manager, morder o Radio producer otros movimientos con la [place]. El sndrome de ATM suele ser leve y desaparece en unas pocas semanas. En ocasiones, sin embargo, la afeccin se Land en un problema a Air cabin crew (crnico). Cules son las causas? Esta afeccin puede ser causada por lo siguiente: Rechinar los dientes o apretar la [place]. Algunas personas lo hacen cuando estn estresadas. Artritis. Una lesin mandibular. Una lesin en la cabeza o el cuello. Piezas dentales o dentaduras postizas que no estn bien alineadas. En algunos casos, es posible que la causa de este sndrome no se conozca. Cules son los signos o los sntomas? El sntoma ms comn de esta afeccin es un dolor continuo en el lado de la cabeza, en la zona de la articulacin temporomandibular. Otros sntomas pueden incluir: Dolor al [employer], por ejemplo, al [employer] o morder. Imposibilidad de abrir [employer]. Producir un chasquido al  abrir la boca. Dolor de [place]. Dolor de odos. Dolor en el cuello o el hombro. Cmo se diagnostica? Esta afeccin se puede diagnosticar en funcin de lo siguiente: Los sntomas y los antecedentes mdicos. Un examen fsico. El mdico puede revisar el rango de movimiento de la [place]. Pruebas de diagnstico por imgenes, como radiografas o una resonancia magntica (RM). Tal vez deba consultar al dentista, quien revisar si las piezas dentales y la mandbula estn alineadas correctamente. Cmo se trata? El sndrome de ATM suele desaparecer solo. Si se necesita tratamiento, este puede incluir lo siguiente: Consumir alimentos blandos y Contractor hielo o calor. Medicamentos para Engineer, materials o la inflamacin. Medicamentos o masajes para [employer]. Una frula dental, una placa de mordida o una boquilla para evitar que se rechinen los dientes o se aprieten las [place]. Tcnicas de relajacin o psicoterapia para ayudar a Museum/gallery exhibitions officer. Tratamiento para Chief Technology Officer en el que se aplica corriente Radio producer a los nervios a travs de la piel (estimulacin nerviosa elctrica transcutnea). Acupuntura. Esto puede ayudar a Engineer, materials. Ciruga de [place]. Esto debe realizarse en contadas ocasiones. Siga estas instrucciones en su casa:  Comida y bebida Consuma una dieta blanda si tiene dificultades para Product manager. No consuma los alimentos que Research scientist (physical sciences). No mastique goma de Theatre manager. Instrucciones generales Use los medicamentos de venta libre y los recetados solamente como se lo haya indicado el mdico. Si se lo indican, aplique hielo sobre la zona dolorida. Para hacer esto: Ponga el hielo en una bolsa plstica. Coloque una toalla entre la piel y Copy. Aplique el hielo durante 20 minutos,  2 o 3 veces por da. Retire el hielo si la piel se pone de color rojo brillante. Esto es [employer]. Si no puede sentir dolor, calor o fro, tiene un mayor riesgo de  que se dae la zona. Aplique un pao mojado y tibio (compresa tibia) sobre la zona dolorida como se lo hayan indicado. Dese un masaje en la zona de la mandbula y haga los ejercicios de estiramiento como se lo haya indicado el mdico. Si le indicaron una frula dental, una placa de mordida o una boquilla, utilcela como se lo haya indicado el mdico. Concurra a todas las visitas de seguimiento. Esto es importante. Dnde buscar ms informacin [employer] of Retail buyer (Instituto Nacional de Investigacin Dental y Craneofacial): [email or web address] Comunquese con un mdico si: Tiene dificultad para comer. Tiene sntomas nuevos o sus sntomas empeoran. Solicite ayuda de inmediato si: Se le traba la mandbula. Resumen El sndrome de la articulacin temporomandibular (sndrome de ATM) es una afeccin que causa dolor en las articulaciones temporomandibulares. Estas articulaciones estn ubicadas cerca de las orejas y permiten abrir y Conservation officer, nature. El sndrome de ATM suele ser leve y desaparece en unas pocas semanas. En ocasiones, sin embargo, la afeccin se Land en un problema a Air cabin crew (crnico). Los sntomas incluyen un dolor continuo en el lado de la cabeza, en la zona de la articulacin temporomandibular, dolor al B[employer] Company o morder e incapacidad para abrir la mandbula totalmente. Tambin puede producir un chasquido al abrir la boca. El sndrome de ATM suele desaparecer solo. En caso de Network engineer, este puede incluir medicamentos para Engineer, materials y reducir la inflamacin o para Armed forces logistics/support/administrative officer. Tambin pueden utilizarse una frula dental, una placa de mordida o una boquilla para evitar que se rechinen los dientes o se aprieten las mandbulas. Esta informacin no tiene Theme park manager el consejo del mdico. Asegrese de hacerle al mdico cualquier pregunta que tenga. Document Revised: 06/30/2021 Document Reviewed:  06/30/2021 Elsevier Patient Education  2024 [employer].

## 2023-05-13 LAB — COMPREHENSIVE METABOLIC PANEL
AG Ratio: 1.9 (calc) (ref 1.0–2.5)
ALT: 15 U/L (ref 6–29)
AST: 15 U/L (ref 10–30)
Albumin: 4.5 g/dL (ref 3.6–5.1)
Alkaline phosphatase (APISO): 78 U/L (ref 31–125)
BUN: 15 mg/dL (ref 7–25)
CO2: 27 mmol/L (ref 20–32)
Calcium: 9.6 mg/dL (ref 8.6–10.2)
Chloride: 105 mmol/L (ref 98–110)
Creat: 0.59 mg/dL (ref 0.50–0.97)
Globulin: 2.4 g/dL (calc) (ref 1.9–3.7)
Glucose, Bld: 76 mg/dL (ref 65–99)
Potassium: 4.3 mmol/L (ref 3.5–5.3)
Sodium: 140 mmol/L (ref 135–146)
Total Bilirubin: 0.3 mg/dL (ref 0.2–1.2)
Total Protein: 6.9 g/dL (ref 6.1–8.1)

## 2023-05-13 LAB — VITAMIN B12: Vitamin B-12: 702 pg/mL (ref 200–1100)

## 2023-05-13 LAB — CBC WITH DIFFERENTIAL/PLATELET
HCT: 40.9 % (ref 35.0–45.0)
Lymphs Abs: 2498 cells/uL (ref 850–3900)
MCHC: 33 g/dL (ref 32.0–36.0)
MPV: 10.4 fL (ref 7.5–12.5)
Neutrophils Relative %: 55.3 %
Platelets: 409 10*3/uL — ABNORMAL HIGH (ref 140–400)
RDW: 11.9 % (ref 11.0–15.0)
Total Lymphocyte: 34.7 %

## 2023-05-13 LAB — LIPID PANEL
Cholesterol: 192 mg/dL (ref <200)
HDL: 71 mg/dL (ref >=50)
LDL Cholesterol (Calc): 106 mg/dL (calc) — ABNORMAL HIGH
Non-HDL Cholesterol (Calc): 121 mg/dL (calc) (ref <130)
Total CHOL/HDL Ratio: 2.7 (calc) (ref <5.0)
Triglycerides: 65 mg/dL (ref <150)

## 2023-05-13 LAB — TSH: TSH: 2.16 mIU/L

## 2023-05-13 LAB — T4, FREE: Free T4: 1.1 ng/dL (ref 0.8–1.8)

## 2023-05-17 LAB — VITAMIN B1: Vitamin B1 (Thiamine): 15 nmol/L (ref 8–30)

## 2023-08-11 LAB — RESULTS CONSOLE HPV: CHL HPV: POSITIVE

## 2023-08-11 LAB — HM PAP SMEAR

## 2024-04-17 ENCOUNTER — Ambulatory Visit
Admission: EM | Admit: 2024-04-17 | Discharge: 2024-04-17 | Disposition: A | Discharge status: Home / Self Care | Attending: Physician Assistant | Admitting: Physician Assistant

## 2024-04-17 ENCOUNTER — Other Ambulatory Visit: Payer: Self-pay

## 2024-04-17 ENCOUNTER — Ambulatory Visit (INDEPENDENT_AMBULATORY_CARE_PROVIDER_SITE_OTHER): Admitting: Radiology

## 2024-04-17 DIAGNOSIS — M25461 Effusion, right knee: Secondary | ICD-10-CM

## 2024-04-17 DIAGNOSIS — S80212A Abrasion, left knee, initial encounter: Secondary | ICD-10-CM

## 2024-04-17 DIAGNOSIS — S8991XA Unspecified injury of right lower leg, initial encounter: Secondary | ICD-10-CM | POA: Diagnosis not present

## 2024-04-17 NOTE — ED Provider Notes (Signed)
 [facility]    CSN: [id number] Arrival date & time: 04/17/24  1900      History   Chief Complaint Chief Complaint  Patient presents with   Knee Injury    HPI Jordan Mendez is a 38 y.o. female.   HPI  Pt reports right knee injury earlier today She reports that she hit her right knee on a trailer hitch. She states that her kneecap was displaced laterally and she had to move it back into place  She reports falling to the ground after injury but did not hit her head or have LOC She reports that she could not move her leg until she moved the kneecap back into regular position She has some lingering discomfort but not pain and took Ibuprofen  PTA  Pt also has scrape and bruising to the left knee from fall   Past Medical History:  Diagnosis Date   Medical history non-contributory     Patient Active Problem List   Diagnosis Date Noted   Fever of unknown origin during puerperium 05/01/2022   Pregnant and not yet delivered 04/26/2022   Pregnant and not yet delivered, second trimester 12/04/2021   Pregnancy 07/03/2018   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 Authorizing Provider  ibuprofen  (ADVIL ) 600 MG tablet Take 1 tablet (600 mg total) by mouth every 6 (six) hours. 05/04/22   Anda Bamberg, MD  methocarbamol  (ROBAXIN -750) 750 MG tablet Take 1 tablet (750 mg total) by mouth  4 (four) times daily. 05/04/22   Anda Bamberg, MD  Prenatal Vit-Fe Fumarate-FA (PRENATAL VITAMIN PO) Prenatal Vitamin    [provider]    Family History Family History  Problem Relation Age of Onset   Kidney disease Maternal Grandmother    Diabetes Maternal Grandmother     Social History Social History   Tobacco Use   Smoking status: Never   Smokeless tobacco: Never  Vaping Use   Vaping status: Never Used  Substance Use Topics   Alcohol use: Not Currently   Drug use: Never     Allergies   Patient has no known allergies.   Review of Systems Review of Systems  Musculoskeletal:  Positive for arthralgias.  Skin:  Positive for wound.     Physical Exam Triage Vital Signs ED Triage Vitals  Encounter Vitals Group     BP 04/17/24 1914 110/66     Girls Systolic BP Percentile --      Girls Diastolic BP Percentile --      Boys Systolic BP Percentile --      Boys Diastolic BP  Percentile --      Pulse Rate 04/17/24 1914 70     Resp 04/17/24 1914 18     Temp 04/17/24 1914 98.1 F (36.7 C)     Temp Source 04/17/24 1914 Oral     SpO2 04/17/24 1914 96 %     Weight 04/17/24 1916 125 lb (56.7 kg)     Height 04/17/24 1916 5' 2 (1.575 m)     Head Circumference --      Peak Flow --      Pain Score 04/17/24 1916 0     Pain Loc --      Pain Education --      Exclude from Growth Chart --    No data found.  Updated Vital Signs BP 110/66 (BP Location: Right Arm)   Pulse 70   Temp 98.1 F (36.7 C) (Oral)   Resp 18   Ht 5' 2 (1.575 m)   Wt 125 lb (56.7 kg)   LMP 04/10/2024 (Exact Date)   SpO2 96%   Breastfeeding No   BMI 22.86 kg/m   Visual Acuity Right Eye Distance:   Left Eye Distance:   Bilateral Distance:    Right Eye Near:   Left Eye Near:    Bilateral Near:     Physical Exam Vitals reviewed.  Constitutional:      General: She is awake.     Appearance: Normal appearance. She is well-developed and well-groomed.  HENT:     Head:  Normocephalic and atraumatic.   Eyes:     General: Lids are normal. Gaze aligned appropriately.     Extraocular Movements: Extraocular movements intact.     Conjunctiva/sclera: Conjunctivae normal.   Pulmonary:     Effort: Pulmonary effort is normal.   Musculoskeletal:     Right knee: Crepitus present. No swelling, deformity, effusion or erythema. Normal range of motion. No tenderness. No LCL laxity, MCL laxity, ACL laxity or PCL laxity. Normal alignment and normal patellar mobility.     Instability Tests: Anterior drawer test negative. Posterior drawer test negative. Medial McMurray test negative and lateral McMurray test negative.     Left knee: Ecchymosis, laceration (abrasion present) and crepitus present. Normal patellar mobility.     Comments: Right knee exam findings Patient has intact range of motion but notable crepitus bilaterally.  No obvious laxity with ACL, PCL, MCL, LCL.  Negative Apley grind test on the right.  No obvious signs of effusion but there is mild swelling along the prepatellar space and superior to the patella   Neurological:     Mental Status: She is alert and oriented to person, place, and time.   Psychiatric:        Attention and Perception: Attention and perception normal.        Mood and Affect: Mood and affect normal.        Speech: Speech normal.        Behavior: Behavior normal. Behavior is cooperative.        Thought Content: Thought content normal.        Judgment: Judgment normal.      UC Treatments / Results  Labs (all labs ordered are listed, but only abnormal results are displayed) Labs Reviewed - No data to display  EKG   Radiology DG Knee Complete 4 Views Right Result Date: 04/17/2024 CLINICAL DATA:  knee injury EXAM: RIGHT KNEE - COMPLETE 4+ VIEW COMPARISON:  None Available. FINDINGS: No acute fracture or dislocation. Small suprapatellar joint effusion.  There is no evidence of arthropathy or other focal bone abnormality. Soft tissues  are unremarkable. IMPRESSION: Small suprapatellar joint effusion. No acute fracture or dislocation. Electronically Signed   By: Rance Burrows M.D.   On: 04/17/2024 19:35    Procedures Procedures (including critical care time)  Medications Ordered in UC Medications - No data to display  Initial Impression / Assessment and Plan / UC Course  I have reviewed the triage vital signs and the nursing notes.  Pertinent labs & imaging results that were available during my care of the patient were reviewed by me and considered in my medical decision making (see chart for details).      Final Clinical Impressions(s) / UC Diagnoses   Final diagnoses:  Injury of right knee, initial encounter  Effusion of right knee  Abrasion of left knee, initial encounter   Patient presents today with concerns for injury to the right knee that occurred earlier today.  She reports that she hit her right knee on a trailer hitch and thinks that her patella was subluxed or dislocated laterally.  She reports that she was able to move her leg after repositioning her knee And this resulted in decreased pain and discomfort.  Physical exam reveals largely normal findings.  Radiology interpretation of imaging does not show signs of dislocation or fracture but did note suprapatellar effusion.  At this time recommend conservative measures to assist with recovery and help prevent recurrence.  Will send patient home with hinged knee brace and recommend Tylenol  and ibuprofen  as needed for further pain relief.  Reviewed with patient and her husband that ibuprofen  is recommended to help with effusion as well as warm compresses and gentle stretches.  Rehab stretches for patella subluxation/dislocation provided in after visit summary.  Reviewed that if symptoms persist or she has recurrent patella subluxation/dislocation that she should follow-up with orthopedics for further evaluation and ongoing management. Patient voiced agreement and  understanding of recommendations. Follow up as needed.     Discharge Instructions      You were seen today for concerns of right knee injury At this time I suspect that you may have dislocated your right kneecap.  Believes that you have already popped it back into place which has led to your normal x-ray results. To help manage your symptoms I am sending you home with a knee brace.  This will help with stabilize your knee and hopefully prevent further injury. I recommend alternating Tylenol  and ibuprofen  as needed for pain management.  You can have up to 3200 mg of ibuprofen  per 24 hours and up to 3500 mg of acetaminophen  per 24 hours.  You can alternate these medications every 4 hours as needed If desired you can apply warm compresses to the area to help with the joint effusion (excess fluid in the knee ). I have included some stretches and rehab in your paperwork for you to do to help with your knee.  If you feel like your symptoms are not improving or if you feel like you have a recurrent injury I recommend following up with orthopedics as you may need further evaluation and ongoing management  [facility] [address], [place], [place] [address] [phone]  [facility] [address] [place], [place] [address]  [phone]       ED Prescriptions   None    PDMP not reviewed this encounter.   Railynn Ballo E, PA-C 04/17/24 2017

## 2024-04-17 NOTE — ED Triage Notes (Signed)
 Pt presents with complaints of right knee injury today at approximately 5 PM today. Pt states she was walking and hit her right knee into a trailer hitch. The knee moved completely to the right side but I popped it back into place myself. Pt currently denies pain. Bruise noted to left knee however pt states she fell onto it after losing her balance.

## 2024-04-17 NOTE — Discharge Instructions (Addendum)
 You were seen today for concerns of right knee injury At this time I suspect that you may have dislocated your right kneecap.  Believes that you have already popped it back into place which has led to your normal x-ray results. To help manage your symptoms I am sending you home with a knee brace.  This will help with stabilize your knee and hopefully prevent further injury. I recommend alternating Tylenol  and ibuprofen  as needed for pain management.  You can have up to 3200 mg of ibuprofen  per 24 hours and up to 3500 mg of acetaminophen  per 24 hours.  You can alternate these medications every 4 hours as needed If desired you can apply warm compresses to the area to help with the joint effusion (excess fluid in the knee ). I have included some stretches and rehab in your paperwork for you to do to help with your knee.  If you feel like your symptoms are not improving or if you feel like you have a recurrent injury I recommend following up with orthopedics as you may need further evaluation and ongoing management  [facility] [address], [address], [place], [place] [address] [phone]  [facility] [address], [place], [place] [address]  [phone]

## 2024-05-16 ENCOUNTER — Ambulatory Visit: Payer: Commercial Managed Care - PPO | Admitting: Medical

## 2024-05-16 ENCOUNTER — Encounter: Payer: Self-pay | Admitting: Medical

## 2024-05-16 ENCOUNTER — Ambulatory Visit: Payer: Self-pay | Admitting: Medical

## 2024-05-16 VITALS — BP 94/64 | HR 72 | Temp 98.0°F | Ht 62.0 in | Wt 129.4 lb

## 2024-05-16 DIAGNOSIS — Z0000 Encounter for general adult medical examination without abnormal findings: Secondary | ICD-10-CM | POA: Diagnosis not present

## 2024-05-16 LAB — COMPREHENSIVE METABOLIC PANEL WITH GFR
ALT: 16 U/L (ref 0–35)
AST: 15 U/L (ref 0–37)
Albumin: 4.3 g/dL (ref 3.5–5.2)
Alkaline Phosphatase: 52 U/L (ref 39–117)
BUN: 13 mg/dL (ref 6–23)
CO2: 28 meq/L (ref 19–32)
Calcium: 9.1 mg/dL (ref 8.4–10.5)
Chloride: 104 meq/L (ref 96–112)
Creatinine, Ser: 0.58 mg/dL (ref 0.40–1.20)
GFR: 115.91 mL/min (ref >60.00)
Glucose, Bld: 85 mg/dL (ref 70–99)
Potassium: 4.3 meq/L (ref 3.5–5.1)
Sodium: 138 meq/L (ref 135–145)
Total Bilirubin: 0.5 mg/dL (ref 0.2–1.2)
Total Protein: 6.6 g/dL (ref 6.0–8.3)

## 2024-05-16 LAB — CBC WITH DIFFERENTIAL/PLATELET
Basophils Absolute: 0.1 K/uL (ref 0.0–0.1)
Basophils Relative: 0.8 % (ref 0.0–3.0)
Eosinophils Absolute: 0.1 K/uL (ref 0.0–0.7)
Eosinophils Relative: 1.3 % (ref 0.0–5.0)
HCT: 40.3 % (ref 36.0–46.0)
Hemoglobin: 13.5 g/dL (ref 12.0–15.0)
Lymphocytes Relative: 35.2 % (ref 12.0–46.0)
Lymphs Abs: 2.1 K/uL (ref 0.7–4.0)
MCHC: 33.5 g/dL (ref 30.0–36.0)
MCV: 92.1 fl (ref 78.0–100.0)
Monocytes Absolute: 0.4 K/uL (ref 0.1–1.0)
Monocytes Relative: 7.3 % (ref 3.0–12.0)
Neutro Abs: 3.4 K/uL (ref 1.4–7.7)
Neutrophils Relative %: 55.4 % (ref 43.0–77.0)
Platelets: 319 K/uL (ref 150.0–400.0)
RBC: 4.38 Mil/uL (ref 3.87–5.11)
RDW: 12.8 % (ref 11.5–15.5)
WBC: 6.1 K/uL (ref 4.0–10.5)

## 2024-05-16 LAB — LIPID PANEL
Cholesterol: 177 mg/dL (ref 0–200)
HDL: 64.4 mg/dL (ref >39.00)
LDL Cholesterol: 99 mg/dL (ref 0–99)
NonHDL: 112.36
Total CHOL/HDL Ratio: 3
Triglycerides: 65 mg/dL (ref 0.0–149.0)
VLDL: 13 mg/dL (ref 0.0–40.0)

## 2024-05-16 NOTE — Progress Notes (Signed)
 Subjective:    Patient ID: Jordan Mendez, female    DOB: January 23, 1986, 38 y.o.   MRN: [id number]  HPI  Pt here for wellness exam. She is fasting.   Pt works at [employer]. Pt has been exercising 3-4 times a week. She has 3 children. All under 45 years old. Pt eats healthy most of time. Non smoker. No alcohol.   On review appears up to date on vaccines.  Pt last pap was one year ago. Asked to get pap report sent to our office.   Past Medical History:  Diagnosis Date   Medical history non-contributory      Social History   Socioeconomic History   Marital status: Married    Spouse name: Not on file   Number of children: Not on file   Years of education: Not on file   Highest education level: Not on file  Occupational History   Not on file  Tobacco Use   Smoking status: Never   Smokeless tobacco: Never  Vaping Use   Vaping status: Never Used  Substance and Sexual Activity   Alcohol use: Not Currently   Drug use: Never   Sexual activity: Not Currently    Birth control/protection: None  Other Topics Concern   Not on file  Social History Narrative   Not on file   Social Drivers of Health   Financial Resource Strain: Low Risk  (05/08/2024)   Received from [facility]   Overall Financial Resource Strain (CARDIA)    Difficulty of Paying Living Expenses: Not hard at all  Food Insecurity: No Food Insecurity (05/08/2024)   Received from [facility]   Hunger Vital Sign    Within the past 12 months, you worried that your food would run out before you got the money to buy more.: Never true    Within the past 12 months, the food you bought just didn't last and you didn't have money to get more.: Never true  Transportation Needs: No Transportation Needs (05/08/2024)   Received from [facility] - Transportation    In the past 12 months, has lack of transportation kept you from medical appointments or from  getting medications?: No    Lack of Transportation (Non-Medical): No  Physical Activity: Inactive (05/16/2024)   Exercise Vital Sign    Days of Exercise per Week: 0 days    Minutes of Exercise per Session: 0 min  Stress: No Stress Concern Present (05/16/2024)   [employer] of Occupational Health - Occupational Stress Questionnaire    Feeling of Stress: Only a little  Social Connections: Moderately Integrated (05/16/2024)   Social Connection and Isolation Panel    Frequency of Communication with Friends and Family: Three times a week    Frequency of Social Gatherings with Friends and Family: Once a week    Attends Religious Services: More than 4 times per year    Active Member of [employer] or Organizations: No    Attends Banker Meetings: Never    Marital Status: Married  Catering manager Violence: Not on file    Past Surgical History:  Procedure Laterality Date   BARTHOLIN CYST MARSUPIALIZATION     CHOLECYSTECTOMY N/A 05/04/2022   Procedure: LAPAROSCOPIC CHOLECYSTECTOMY;  Surgeon: Paola Dreama SAILOR, MD;  Location: [facility] OR;  Service: General;  Laterality: N/A;   CLUB FOOT RELEASE     TONSILLECTOMY AND ADENOIDECTOMY      Family History  Problem Relation Age of Onset   Kidney disease Maternal Grandmother    Diabetes Maternal Grandmother     No Known Allergies  Current Outpatient Medications on File Prior to Visit  Medication Sig Dispense Refill   ibuprofen  (ADVIL ) 600 MG tablet Take 1 tablet (600 mg total) by mouth every 6 (six) hours. 120 tablet 1   methocarbamol  (ROBAXIN -750) 750 MG tablet Take 1 tablet (750 mg total) by mouth 4 (four) times daily. (Patient not taking: Reported on 05/16/2024) 120 tablet 1   Prenatal Vit-Fe Fumarate-FA (PRENATAL VITAMIN PO) Prenatal Vitamin (Patient not taking: Reported on 05/16/2024)     No current facility-administered medications on file prior to visit.    BP 94/64   Pulse 72   Temp 98 F (36.7 C) (Oral)   Ht 5' 2 (1.575 m)    Wt 129 lb 6.4 oz (58.7 kg)   LMP 04/10/2024 (Exact Date)   SpO2 99%   BMI 23.67 kg/m       Review of Systems  Constitutional:  Negative for chills, fatigue and fever.  HENT:  Negative for congestion and ear discharge.   Respiratory:  Negative for cough, chest tightness and wheezing.   Cardiovascular:  Negative for chest pain and palpitations.  Gastrointestinal:  Negative for abdominal pain.  Genitourinary:  Negative for dysuria and frequency.  Musculoskeletal:  Negative for back pain and gait problem.  Skin:  Negative for rash.  Neurological:  Negative for dizziness, speech difficulty, weakness and light-headedness.  Hematological:  Negative for adenopathy. Does not bruise/bleed easily.  Psychiatric/Behavioral:  Negative for behavioral problems and confusion.       Past Medical History:  Diagnosis Date   Medical history non-contributory      Social History   Socioeconomic History   Marital status: Married    Spouse name: Not on file   Number of children: Not on file   Years of education: Not on file   Highest education level: Not on file  Occupational History   Not on file  Tobacco Use   Smoking status: Never   Smokeless tobacco: Never  Vaping Use   Vaping status: Never Used  Substance and Sexual Activity   Alcohol use: Not Currently   Drug use: Never   Sexual activity: Not Currently    Birth control/protection: None  Other Topics Concern   Not on file  Social History Narrative   Not on file   Social Drivers of Health   Financial Resource Strain: Low Risk  (05/08/2024)   Received from [facility]   Overall Financial Resource Strain (CARDIA)    Difficulty of Paying Living Expenses: Not hard at all  Food Insecurity: No Food Insecurity (05/08/2024)   Received from [facility]   Hunger Vital Sign    Within the past 12 months, you worried that your food would run out before you got the money to buy more.: Never true    Within  the past 12 months, the food you bought just didn't last and you didn't have money to get more.: Never true  Transportation Needs: No Transportation Needs (05/08/2024)   Received from [facility] - Transportation    In the past 12 months, has lack of transportation kept you from medical appointments or from getting medications?: No    Lack of Transportation (Non-Medical): No  Physical Activity: Inactive (05/16/2024)   Exercise Vital Sign    Days of Exercise per Week: 0 days  Minutes of Exercise per Session: 0 min  Stress: No Stress Concern Present (05/16/2024)   [employer] of Occupational Health - Occupational Stress Questionnaire    Feeling of Stress: Only a little  Social Connections: Moderately Integrated (05/16/2024)   Social Connection and Isolation Panel    Frequency of Communication with Friends and Family: Three times a week    Frequency of Social Gatherings with Friends and Family: Once a week    Attends Religious Services: More than 4 times per year    Active Member of [employer] or Organizations: No    Attends Banker Meetings: Never    Marital Status: Married  Catering manager Violence: Not on file    Past Surgical History:  Procedure Laterality Date   BARTHOLIN CYST MARSUPIALIZATION     CHOLECYSTECTOMY N/A 05/04/2022   Procedure: LAPAROSCOPIC CHOLECYSTECTOMY;  Surgeon: Paola Dreama SAILOR, MD;  Location: [facility] OR;  Service: General;  Laterality: N/A;   CLUB FOOT RELEASE     TONSILLECTOMY AND ADENOIDECTOMY      Family History  Problem Relation Age of Onset   Kidney disease Maternal Grandmother    Diabetes Maternal Grandmother     No Known Allergies  Current Outpatient Medications on File Prior to Visit  Medication Sig Dispense Refill   ibuprofen  (ADVIL ) 600 MG tablet Take 1 tablet (600 mg total) by mouth every 6 (six) hours. 120 tablet 1   methocarbamol  (ROBAXIN -750) 750 MG tablet Take 1 tablet (750 mg total) by mouth 4 (four)  times daily. (Patient not taking: Reported on 05/16/2024) 120 tablet 1   Prenatal Vit-Fe Fumarate-FA (PRENATAL VITAMIN PO) Prenatal Vitamin (Patient not taking: Reported on 05/16/2024)     No current facility-administered medications on file prior to visit.    BP 94/64   Pulse 72   Temp 98 F (36.7 C) (Oral)   Ht 5' 2 (1.575 m)   Wt 129 lb 6.4 oz (58.7 kg)   LMP 04/10/2024 (Exact Date)   SpO2 99%   BMI 23.67 kg/m        Objective:   Physical Exam  General Mental Status- Alert. General Appearance- Not in acute distress.   Skin Normal. No worrisome skin lesions presently.  Neck  No JVD.  Chest and Lung Exam Auscultation: Breath Sounds:-CTA  Cardiovascular Auscultation:Rythm- RRR Murmurs & Other Heart Sounds:Auscultation of the heart reveals- No Murmurs.  Abdomen Inspection:-Inspeection Normal. Palpation/Percussion:Note:No mass. Palpation and Percussion of the abdomen reveal- Non Tender, Non Distended + BS, no rebound or guarding.   Neurologic Cranial Nerve exam:- CN III-XII intact(No nystagmus), symmetric smile. Strength:- 5/5 equal and symmetric strength both upper and lower extremities.       Assessment & Plan:   Patient Instructions  For you wellness exam today I have ordered cbc, cmp and lipid panel  Vaccine up to date on review. Discussed hpv vaccine. If desire to get advised check with insurance and can also get gyn opinion.  Pt last pap was one year ago. Asked to get pap report sent to our office.  Recommend exercise and healthy diet.  We will let you know lab results as they come in.  Follow up date appointment will be determined after lab review.      Ryun Velez, PA-C

## 2024-05-16 NOTE — Patient Instructions (Addendum)
 For you wellness exam today I have ordered cbc, cmp and lipid panel  Vaccine up to date on review. Discussed hpv vaccine. If desire to get advised check with insurance and can also get gyn opinion.  Pt last pap was one year ago. Asked to get pap report sent to our office.  Recommend exercise and healthy diet.  We will let you know lab results as they come in.(When can pick up paper work)  Follow up date appointment will be determined after lab review.    Preventive Care 82-27 Years Old, Female Preventive care refers to lifestyle choices and visits with your health care provider that can promote health and wellness. Preventive care visits are also called wellness exams. What can I expect for my preventive care visit? Counseling During your preventive care visit, your health care provider may ask about your: Medical history, including: Past medical problems. Family medical history. Pregnancy history. Current health, including: Menstrual cycle. Method of birth control. Emotional well-being. Home life and relationship well-being. Sexual activity and sexual health. Lifestyle, including: Alcohol, nicotine or tobacco, and drug use. Access to firearms. Diet, exercise, and sleep habits. Work and work Astronomer. Sunscreen use. Safety issues such as seatbelt and bike helmet use. Physical exam Your health care provider may check your: Height and weight. These may be used to calculate your BMI (body mass index). BMI is a measurement that tells if you are at a healthy weight. Waist circumference. This measures the distance around your waistline. This measurement also tells if you are at a healthy weight and may help predict your risk of certain diseases, such as type 2 diabetes and high blood pressure. Heart rate and blood pressure. Body temperature. Skin for abnormal spots. What immunizations do I need?  Vaccines are usually given at various ages, according to a schedule. Your health  care provider will recommend vaccines for you based on your age, medical history, and lifestyle or other factors, such as travel or where you work. What tests do I need? Screening Your health care provider may recommend screening tests for certain conditions. This may include: Pelvic exam and Pap test. Lipid and cholesterol levels. Diabetes screening. This is done by checking your blood sugar (glucose) after you have not eaten for a while (fasting). Hepatitis B test. Hepatitis C test. HIV (human immunodeficiency virus) test. STI (sexually transmitted infection) testing, if you are at risk. BRCA-related cancer screening. This may be done if you have a family history of breast, ovarian, tubal, or peritoneal cancers. Talk with your health care provider about your test results, treatment options, and if necessary, the need for more tests. Follow these instructions at home: Eating and drinking  Eat a healthy diet that includes fresh fruits and vegetables, whole grains, lean protein, and low-fat dairy products. Take vitamin and mineral supplements as recommended by your health care provider. Do not drink alcohol if: Your health care provider tells you not to drink. You are pregnant, may be pregnant, or are planning to become pregnant. If you drink alcohol: Limit how much you have to 0-1 drink a day. Know how much alcohol is in your drink. In the [place] one drink equals one 12 oz bottle of beer (355 mL), one 5 oz glass of wine (148 mL), or one 1 oz glass of hard liquor (44 mL). Lifestyle Brush your teeth every morning and night with fluoride toothpaste. Floss one time each day. Exercise for at least 30 minutes 5 or more days each week. Do not  use any products that contain nicotine or tobacco. These products include cigarettes, chewing tobacco, and vaping devices, such as e-cigarettes. If you need help quitting, ask your health care provider. Do not use drugs. If you are sexually active,  practice safe sex. Use a condom or other form of protection to prevent STIs. If you do not wish to become pregnant, use a form of birth control. If you plan to become pregnant, see your health care provider for a prepregnancy visit. Find healthy ways to manage stress, such as: Meditation, yoga, or listening to music. Journaling. Talking to a trusted person. Spending time with friends and family. Minimize exposure to UV radiation to reduce your risk of skin cancer. Safety Always wear your seat belt while driving or riding in a vehicle. Do not drive: If you have been drinking alcohol. Do not ride with someone who has been drinking. If you have been using any mind-altering substances or drugs. While texting. When you are tired or distracted. Wear a helmet and other protective equipment during sports activities. If you have firearms in your house, make sure you follow all gun safety procedures. Seek help if you have been physically or sexually abused. What's next? Go to your health care provider once a year for an annual wellness visit. Ask your health care provider how often you should have your eyes and teeth checked. Stay up to date on all vaccines. This information is not intended to replace advice given to you by your health care provider. Make sure you discuss any questions you have with your health care provider. Document Revised: 04/14/2021 Document Reviewed: 04/14/2021 Elsevier Patient Education  2024 [employer].

## 2024-05-17 ENCOUNTER — Telehealth: Payer: Self-pay | Admitting: Medical

## 2024-05-17 NOTE — Telephone Encounter (Signed)
 Pt can pick up her work physical form or can you fax

## 2024-05-20 NOTE — Telephone Encounter (Signed)
 Form faxed

## 2024-05-23 ENCOUNTER — Ambulatory Visit: Payer: Self-pay | Admitting: Family

## 2024-08-26 ENCOUNTER — Ambulatory Visit: Admitting: Medical

## 2024-08-26 VITALS — BP 110/68 | HR 83 | Temp 98.2°F | Resp 16 | Ht 62.0 in | Wt 132.4 lb

## 2024-08-26 DIAGNOSIS — J069 Acute upper respiratory infection, unspecified: Secondary | ICD-10-CM | POA: Diagnosis not present

## 2024-08-26 DIAGNOSIS — R059 Cough, unspecified: Secondary | ICD-10-CM

## 2024-08-26 DIAGNOSIS — R0981 Nasal congestion: Secondary | ICD-10-CM

## 2024-08-26 MED ORDER — FLUTICASONE PROPIONATE 50 MCG/ACT NA SUSP
2.0000 | Freq: Every day | NASAL | 1 refills | Status: AC
Start: 2024-08-26 — End: ?

## 2024-08-26 MED ORDER — AZITHROMYCIN 250 MG PO TABS: ORAL_TABLET | ORAL | 0 refills | Status: AC

## 2024-08-26 MED ORDER — BENZONATATE 100 MG PO CAPS: 100.0000 mg | ORAL_CAPSULE | Freq: Three times a day (TID) | ORAL | 0 refills | Status: AC | PRN

## 2024-08-26 NOTE — Patient Instructions (Signed)
 Acute upper respiratory infection Symptoms suggest a bacterial component due to green sputum and duration(also concern upcoming foot surgery may be cancelled if presents with sinus infection or bronchitis like symptoms day of surgery. Over-the-counter treatments ineffective. - Prescribed azithromycin for 5 days. - Benzonatate cough suppressant and flonase  nasal spray. - Instructed to report worsening symptoms before the weekend.  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 If presents on day of surgery sick concern may impact surgery. Possible rescheduling and delay up to months. - Ensure management of respiratory symptoms before surgery. - Avoid ibuprofen  and restricted medications one week prior to surgery.  Follow up 5-7 days or sooner if needed

## 2024-08-26 NOTE — Progress Notes (Signed)
 Subjective:    Patient ID: Jordan Mendez, female    DOB: April 06, 1986, 38 y.o.   MRN: [id number]  HPI Jordan Mendez is a 38 year old female who presents with persistent upper respiratory symptoms for 11 days.  She has significant nasal congestion, particularly worsening at night, which forces her to sleep in a chair due to difficulty lying down. She experiences excessive nighttime coughing. Her mucus is described as green when she coughs or blows her nose. No wheezing or shortness of breath.  She has been using various treatments including antihistamines every 4-5 hours, Mucinex, saline nasal spray, ginger lozenges, Vicks VapoRub, and vaporizers, but notes no improvement in her symptoms.  She is concerned about her upcoming foot surgery scheduled for September 06, 2024, and mentions that she cannot take ibuprofen  or certain other medications a week prior to the surgery.  She is concerned if still sick by time surgery that surgery may be canceled and have to reschedule. Told next available appointment slot for spring.         Review of Systems  Constitutional:  Negative for chills, fatigue and fever.  HENT:  Positive for congestion. Negative for ear pain.   Respiratory:  Positive for cough. Negative for choking, shortness of breath and wheezing.   Cardiovascular:  Negative for chest pain and palpitations.  Gastrointestinal:  Negative for abdominal pain, constipation and rectal pain.  Genitourinary:  Negative for dysuria, flank pain and hematuria.  Musculoskeletal:  Negative for back pain and myalgias.  Skin:  Negative for rash.  Neurological:  Negative for dizziness, speech difficulty, weakness and light-headedness.  Hematological:  Negative for adenopathy.  Psychiatric/Behavioral:  Negative for confusion, hallucinations and sleep disturbance.    Past Medical History:  Diagnosis Date   Medical history non-contributory      Social History   Socioeconomic History   Marital status:  Married    Spouse name: Not on file   Number of children: Not on file   Years of education: Not on file   Highest education level: Not on file  Occupational History   Not on file  Tobacco Use   Smoking status: Never   Smokeless tobacco: Never  Vaping Use   Vaping status: Never Used  Substance and Sexual Activity   Alcohol use: Not Currently   Drug use: Never   Sexual activity: Not Currently    Birth control/protection: None  Other Topics Concern   Not on file  Social History Narrative   Not on file   Social Drivers of Health   Financial Resource Strain: Low Risk  (05/08/2024)   Received from [facility]   Overall Financial Resource Strain (CARDIA)    Difficulty of Paying Living Expenses: Not hard at all  Food Insecurity: No Food Insecurity (05/08/2024)   Received from [facility]   Hunger Vital Sign    Within the past 12 months, you worried that your food would run out before you got the money to buy more.: Never true    Within the past 12 months, the food you bought just didn't last and you didn't have money to get more.: Never true  Transportation Needs: No Transportation Needs (05/08/2024)   Received from [facility]    In the past 12 months, has lack of transportation kept you from medical appointments or from getting medications?: No    Lack of Transportation (Non-Medical): No  Physical Activity: Inactive (05/16/2024)  Exercise Vital Sign    Days of Exercise per Week: 0 days    Minutes of Exercise per Session: 0 min  Stress: No Stress Concern Present (05/16/2024)   [employer] of Occupational Health - Occupational Stress Questionnaire    Feeling of Stress: Only a little  Social Connections: Moderately Integrated (05/16/2024)   Social Connection and Isolation Panel    Frequency of Communication with Friends and Family: Three times a week    Frequency of Social Gatherings with Friends and  Family: Once a week    Attends Religious Services: More than 4 times per year    Active Member of [employer] or Organizations: No    Attends Banker Meetings: Never    Marital Status: Married  Catering Manager Violence: Not on file    Past Surgical History:  Procedure Laterality Date   BARTHOLIN CYST MARSUPIALIZATION     CHOLECYSTECTOMY N/A 05/04/2022   Procedure: LAPAROSCOPIC CHOLECYSTECTOMY;  Surgeon: Paola Dreama SAILOR, MD;  Location: [facility] OR;  Service: General;  Laterality: N/A;   CLUB FOOT RELEASE     TONSILLECTOMY AND ADENOIDECTOMY      Family History  Problem Relation Age of Onset   Kidney disease Maternal Grandmother    Diabetes Maternal Grandmother     No Known Allergies  Current Outpatient Medications on File Prior to Visit  Medication Sig Dispense Refill   ibuprofen  (ADVIL ) 600 MG tablet Take 1 tablet (600 mg total) by mouth every 6 (six) hours. 120 tablet 1   methocarbamol  (ROBAXIN -750) 750 MG tablet Take 1 tablet (750 mg total) by mouth 4 (four) times daily. (Patient not taking: Reported on 08/26/2024) 120 tablet 1   Prenatal Vit-Fe Fumarate-FA (PRENATAL VITAMIN PO) Prenatal Vitamin (Patient not taking: Reported on 08/26/2024)     No current facility-administered medications on file prior to visit.    BP 110/68   Pulse 83   Temp 98.2 F (36.8 C) (Oral)   Resp 16   Ht 5' 2 (1.575 m)   Wt 132 lb 6.4 oz (60.1 kg)   LMP 08/13/2024 (Exact Date)   SpO2 98%   BMI 24.22 kg/m          Objective:   Physical Exam   General- No acute distress. Pleasant patient. Neck- Full range of motion, no jvd Lungs- Clear, even and unlabored. Heart- regular rate and rhythm. Neurologic- CNII- XII grossly intact.  Heent- moderate nasal congestion. No sinus pressure. +pnd. Canals clear and normal tms.      Assessment & Plan:   Acute upper respiratory infection Symptoms suggest a bacterial component due to green sputum and duration(also concern upcoming foot  surgery may be cancelled if presents with sinus infection or bronchitis like symptoms day of surgery. Over-the-counter treatments ineffective. - Prescribed azithromycin for 5 days. - Benzonatate cough suppressant and flonase  nasal spray. - Instructed to report worsening symptoms before the weekend.  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 If presents on day of surgery sick concern may impact surgery. Possible rescheduling and delay up to months. - Ensure management of respiratory symptoms before surgery. - Avoid ibuprofen  and restricted medications one week prior to surgery.  Follow up 5-7 days or sooner if needed  [employer], PA-C

## 2025-05-20 ENCOUNTER — Encounter: Admitting: Medical
